# Patient Record
Sex: Female | Born: 1980 | Race: Black or African American | Hispanic: No | Marital: Single | Smoking: Never smoker
Health system: Southern US, Community
[De-identification: ages and names within clinical notes are randomized; demographics above are authoritative.]

## PROBLEM LIST (undated history)

## (undated) DIAGNOSIS — J45909 Unspecified asthma, uncomplicated: Secondary | ICD-10-CM

## (undated) HISTORY — DX: Unspecified asthma, uncomplicated: J45.909

## (undated) HISTORY — PX: CORNEAL TRANSPLANT: SHX108

---

## 2009-01-27 LAB — HM PAP SMEAR: HM Pap smear: NORMAL

## 2011-11-24 ENCOUNTER — Inpatient Hospital Stay: Payer: Self-pay

## 2011-11-24 LAB — CBC WITH DIFFERENTIAL/PLATELET
Basophil #: 0 10*3/uL (ref 0.0–0.1)
Basophil %: 0.2 %
Eosinophil #: 0.2 10*3/uL (ref 0.0–0.7)
HCT: 33.4 % — ABNORMAL LOW (ref 35.0–47.0)
HGB: 10.9 g/dL — ABNORMAL LOW (ref 12.0–16.0)
Lymphocyte #: 1.8 10*3/uL (ref 1.0–3.6)
Lymphocyte %: 17.3 %
MCH: 27.7 pg (ref 26.0–34.0)
MCHC: 32.7 g/dL (ref 32.0–36.0)
MCV: 85 fL (ref 80–100)
Monocyte #: 0.5 10*3/uL (ref 0.0–0.7)
Monocyte %: 4.6 %
Neutrophil #: 8.1 10*3/uL — ABNORMAL HIGH (ref 1.4–6.5)
Platelet: 276 10*3/uL (ref 150–440)
RBC: 3.94 10*6/uL (ref 3.80–5.20)
RDW: 16.1 % — ABNORMAL HIGH (ref 11.5–14.5)
WBC: 10.7 10*3/uL (ref 3.6–11.0)

## 2012-08-27 DIAGNOSIS — Z947 Corneal transplant status: Secondary | ICD-10-CM | POA: Insufficient documentation

## 2016-09-05 ENCOUNTER — Ambulatory Visit (INDEPENDENT_AMBULATORY_CARE_PROVIDER_SITE_OTHER): Payer: PRIVATE HEALTH INSURANCE | Admitting: Internal Medicine

## 2016-09-05 ENCOUNTER — Encounter: Payer: Self-pay | Admitting: Internal Medicine

## 2016-09-05 VITALS — BP 114/76 | HR 74 | Temp 97.6°F | Ht 66.0 in | Wt 259.0 lb

## 2016-09-05 DIAGNOSIS — Z6841 Body Mass Index (BMI) 40.0 and over, adult: Secondary | ICD-10-CM | POA: Insufficient documentation

## 2016-09-05 DIAGNOSIS — J4520 Mild intermittent asthma, uncomplicated: Secondary | ICD-10-CM | POA: Diagnosis not present

## 2016-09-05 DIAGNOSIS — N926 Irregular menstruation, unspecified: Secondary | ICD-10-CM

## 2016-09-05 NOTE — Assessment & Plan Note (Signed)
Encouraged her to consume a low carb diet, calorie count 1200 calories per day for weight loss Encouraged her to get back into the gym and star exercising

## 2016-09-05 NOTE — Patient Instructions (Signed)
Carbohydrate Counting for Diabetes Mellitus, Adult Carbohydrate counting is a method for keeping track of how many carbohydrates you eat. Eating carbohydrates naturally increases the amount of sugar (glucose) in the blood. Counting how many carbohydrates you eat helps keep your blood glucose within normal limits, which helps you manage your diabetes (diabetes mellitus). It is important to know how many carbohydrates you can safely have in each meal. This is different for every person. A diet and nutrition specialist (registered dietitian) can help you make a meal plan and calculate how many carbohydrates you should have at each meal and snack. Carbohydrates are found in the following foods:  Grains, such as breads and cereals.  Dried beans and soy products.  Starchy vegetables, such as potatoes, peas, and corn.  Fruit and fruit juices.  Milk and yogurt.  Sweets and snack foods, such as cake, cookies, candy, chips, and soft drinks. How do I count carbohydrates? There are two ways to count carbohydrates in food. You can use either of the methods or a combination of both. Reading "Nutrition Facts" on packaged food  The "Nutrition Facts" list is included on the labels of almost all packaged foods and beverages in the [place] It includes:  The serving size.  Information about nutrients in each serving, including the grams (g) of carbohydrate per serving. To use the "Nutrition Facts":  Decide how many servings you will have.  Multiply the number of servings by the number of carbohydrates per serving.  The resulting number is the total amount of carbohydrates that you will be having. Learning standard serving sizes of other foods  When you eat foods containing carbohydrates that are not packaged or do not include "Nutrition Facts" on the label, you need to measure the servings in order to count the amount of carbohydrates:  Measure the foods that you will eat with a food scale or measuring  cup, if needed.  Decide how many standard-size servings you will eat.  Multiply the number of servings by 15. Most carbohydrate-rich foods have about 15 g of carbohydrates per serving.  For example, if you eat 8 oz (170 g) of strawberries, you will have eaten 2 servings and 30 g of carbohydrates (2 servings x 15 g = 30 g).  For foods that have more than one food mixed, such as soups and casseroles, you must count the carbohydrates in each food that is included. The following list contains standard serving sizes of common carbohydrate-rich foods. Each of these servings has about 15 g of carbohydrates:   hamburger bun or  English muffin.   oz (15 mL) syrup.   oz (14 g) jelly.  1 slice of bread.  1 six-inch tortilla.  3 oz (85 g) cooked rice or pasta.  4 oz (113 g) cooked dried beans.  4 oz (113 g) starchy vegetable, such as peas, corn, or potatoes.  4 oz (113 g) hot cereal.  4 oz (113 g) mashed potatoes or  of a large baked potato.  4 oz (113 g) canned or frozen fruit.  4 oz (120 mL) fruit juice.  4-6 crackers.  6 chicken nuggets.  6 oz (170 g) unsweetened dry cereal.  6 oz (170 g) plain fat-free yogurt or yogurt sweetened with artificial sweeteners.  8 oz (240 mL) milk.  8 oz (170 g) fresh fruit or one small piece of fruit.  24 oz (680 g) popped popcorn. Example of carbohydrate counting Sample meal  3 oz (85 g) chicken breast.  6 oz (  170 g) brown rice.  4 oz (113 g) corn.  8 oz (240 mL) milk.  8 oz (170 g) strawberries with sugar-free whipped topping. Carbohydrate calculation 1. Identify the foods that contain carbohydrates:  Rice.  Corn.  Milk.  Strawberries. 2. Calculate how many servings you have of each food:  2 servings rice.  1 serving corn.  1 serving milk.  1 serving strawberries. 3. Multiply each number of servings by 15 g:  2 servings rice x 15 g = 30 g.  1 serving corn x 15 g = 15 g.  1 serving milk x 15 g = 15  g.  1 serving strawberries x 15 g = 15 g. 4. Add together all of the amounts to find the total grams of carbohydrates eaten:  30 g + 15 g + 15 g + 15 g = 75 g of carbohydrates total. This information is not intended to replace advice given to you by your health care provider. Make sure you discuss any questions you have with your health care provider. Document Released: 08/21/2005 Document Revised: 03/10/2016 Document Reviewed: 02/02/2016 Elsevier Interactive Patient Education  2017 Elsevier Inc.  

## 2016-09-05 NOTE — Assessment & Plan Note (Signed)
Continue Albuterol prn 

## 2016-09-05 NOTE — Progress Notes (Signed)
HPI  Pt presents to the clinic today to establish care and for management of the conditions listed below. She is transferring care from [facility].  Asthma, mild intermittent: She has an Albuterol which she only takes when she is doing a lot of physical activity or when it's extremely cold outside.  She is also concerned about her weight. She reports she has been monitoring what she eats and exercising but it seems like she is gains weight. She only eats lean meat, mostly chicken. She does eat some fruits and veggies. She does consume some carbs. She drinks mostly water. She reports she has not been to the gym over the last month.  She also would like her IUD taken out today and receive the Depo Injection. She is G6P4 and is not planning on having any more children.  Flu: never [facility]: [facility] Pap Sm[facility]: [facility] Vision: annually, [facility] Dentist: annually  Past Medical History:  Diagnosis Date  . Asthma     No current outpatient prescriptions on file.   No current facility-administered medications for this visit.     Allergies  Allergen Reactions  . Fruit Extracts Hives    strawberries    Family History  Problem Relation Age of Onset  . Breast cancer Maternal Grandmother     Social History   Social History  . Marital status: Single    Spouse name: N/A  . Number of children: N/A  . Years of education: N/A   Occupational History  . Not on file.   Social History Main Topics  . Smoking status: Never Smoker  . Smokeless tobacco: Never Used  . Alcohol use Yes     Comment: occasional  . Drug use: No  . Sexual activity: Not on file   Other Topics Concern  . Not on file   Social History Narrative  . No narrative on file    ROS:  Constitutional: Pt reports weight gain. Denies fever, malaise, fatigue, headache.  HEENT: Denies eye pain, eye redness, ear pain, ringing in the ears, wax buildup, runny nose, nasal  congestion, bloody nose, or sore throat. Respiratory: Denies difficulty breathing, shortness of breath, cough or sputum production.   Cardiovascular: Denies chest pain, chest tightness, palpitations or swelling in the hands or feet.  Gastrointestinal: Denies abdominal pain, bloating, constipation, diarrhea or blood in the stool.  GU: Pt reports irregular periods. Denies frequency, urgency, pain with urination, blood in urine, odor or discharge. Musculoskeletal: Denies decrease in range of motion, difficulty with gait, muscle pain or joint pain and swelling.  Skin: Denies redness, rashes, lesions or ulcercations.  Neurological: Denies dizziness, difficulty with memory, difficulty with speech or problems with balance and coordination.  Psych: Denies anxiety, depression, SI/HI.  No other specific complaints in a complete review of systems (except as listed in HPI above).  PE:  BP 114/76   Pulse 74   Temp 97.6 F (36.4 C) (Oral)   Ht 5\' 6"  (1.676 m)   Wt 259 lb (117.5 kg)   LMP 08/26/2016   SpO2 98%   BMI 41.80 kg/m  Wt Readings from Last 3 Encounters:  09/05/16 259 lb (117.5 kg)    General: Appears her stated age, obese in NAD. Cardiovascular: Normal rate and rhythm.  Pulmonary/Chest: Normal effort and positive vesicular breath sounds. No respiratory distress. No wheezes, rales or ronchi noted.  Abdomen: Soft and nontender. Normal bowel sounds Neurological: Alert and oriented.  Psychiatric: Mood  and affect normal. Behavior is normal. Judgment and thought content normal.   Assessment and Plan:  Irregular Periods:  Advised her to make an appt with GYN to have her IUD removed and start Depo injection We can continue Depo injections here  RTC in 2 months for your annual exam Nicki Reaper, NP

## 2016-11-13 ENCOUNTER — Encounter: Payer: PRIVATE HEALTH INSURANCE | Admitting: Internal Medicine

## 2016-11-13 NOTE — Progress Notes (Deleted)
   Subjective:    Patient ID: Virginia BonineLashawna Harris, female    DOB: 03-04-1981, 36 y.o.   MRN: [id number]  HPI  Pt presents to the clinic today for her annual exam.  Flu: Tetanus: Pap Smear: Dentist:  Diet: Exercise:  Review of Systems      Past Medical History:  Diagnosis Date  . Asthma     No current outpatient prescriptions on file.   No current facility-administered medications for this visit.     Allergies  Allergen Reactions  . Fruit Extracts Hives    strawberries    Family History  Problem Relation Age of Onset  . Breast cancer Maternal Grandmother     Social History   Social History  . Marital status: Single    Spouse name: N/A  . Number of children: N/A  . Years of education: N/A   Occupational History  . Not on file.   Social History Main Topics  . Smoking status: Never Smoker  . Smokeless tobacco: Never Used  . Alcohol use Yes     Comment: occasional  . Drug use: No  . Sexual activity: Yes    Birth control/ protection: IUD   Other Topics Concern  . Not on file   Social History Narrative  . No narrative on file     Constitutional: Denies fever, malaise, fatigue, headache or abrupt weight changes.  HEENT: Denies eye pain, eye redness, ear pain, ringing in the ears, wax buildup, runny nose, nasal congestion, bloody nose, or sore throat. Respiratory: Denies difficulty breathing, shortness of breath, cough or sputum production.   Cardiovascular: Denies chest pain, chest tightness, palpitations or swelling in the hands or feet.  Gastrointestinal: Denies abdominal pain, bloating, constipation, diarrhea or blood in the stool.  GU: Denies urgency, frequency, pain with urination, burning sensation, blood in urine, odor or discharge. Musculoskeletal: Denies decrease in range of motion, difficulty with gait, muscle pain or joint pain and swelling.  Skin: Denies redness, rashes, lesions or ulcercations.  Neurological: Denies dizziness, difficulty  with memory, difficulty with speech or problems with balance and coordination.  Psych: Denies anxiety, depression, SI/HI.  No other specific complaints in a complete review of systems (except as listed in HPI above).  Objective:   Physical Exam        Assessment & Plan:

## 2016-11-27 ENCOUNTER — Ambulatory Visit: Payer: Self-pay

## 2016-11-29 ENCOUNTER — Ambulatory Visit: Payer: Self-pay

## 2016-11-29 ENCOUNTER — Other Ambulatory Visit: Payer: Self-pay | Admitting: Obstetrics and Gynecology

## 2016-11-29 ENCOUNTER — Ambulatory Visit (INDEPENDENT_AMBULATORY_CARE_PROVIDER_SITE_OTHER): Payer: PRIVATE HEALTH INSURANCE

## 2016-11-29 DIAGNOSIS — Z308 Encounter for other contraceptive management: Secondary | ICD-10-CM

## 2016-11-29 MED ORDER — MEDROXYPROGESTERONE ACETATE 150 MG/ML IM SUSP
150.0000 mg | Freq: Once | INTRAMUSCULAR | Status: AC
  Administered 2016-11-29: 150 mg via INTRAMUSCULAR

## 2016-11-29 NOTE — Telephone Encounter (Signed)
Refill erx'd .  Pt had called.  Adv. Pt to come in at 4pm .

## 2016-11-29 NOTE — Progress Notes (Signed)
Depo given IM right deltoid today.

## 2017-02-12 ENCOUNTER — Telehealth: Payer: Self-pay | Admitting: Obstetrics and Gynecology

## 2017-02-12 DIAGNOSIS — Z308 Encounter for other contraceptive management: Secondary | ICD-10-CM

## 2017-02-12 MED ORDER — MEDROXYPROGESTERONE ACETATE 150 MG/ML IM SUSY: 150.0000 mg | PREFILLED_SYRINGE | INTRAMUSCULAR | 0 refills | Status: DC

## 2017-02-12 NOTE — Telephone Encounter (Signed)
Pt coming for annual on 7/26 but has depo appt this Friday. Please send in rx for dep[facility] to [facility] [address] so she can come Friday.

## 2017-02-12 NOTE — Telephone Encounter (Signed)
rx sent

## 2017-02-16 ENCOUNTER — Ambulatory Visit: Payer: PRIVATE HEALTH INSURANCE

## 2017-02-16 ENCOUNTER — Ambulatory Visit (INDEPENDENT_AMBULATORY_CARE_PROVIDER_SITE_OTHER): Payer: PRIVATE HEALTH INSURANCE

## 2017-02-16 DIAGNOSIS — Z3042 Encounter for surveillance of injectable contraceptive: Secondary | ICD-10-CM | POA: Diagnosis not present

## 2017-02-16 MED ORDER — MEDROXYPROGESTERONE ACETATE 150 MG/ML IM SUSP
150.0000 mg | Freq: Once | INTRAMUSCULAR | Status: AC
  Administered 2017-02-16: 150 mg via INTRAMUSCULAR

## 2017-03-13 ENCOUNTER — Encounter: Payer: Self-pay | Admitting: Internal Medicine

## 2017-03-13 ENCOUNTER — Ambulatory Visit (INDEPENDENT_AMBULATORY_CARE_PROVIDER_SITE_OTHER): Payer: PRIVATE HEALTH INSURANCE | Admitting: Internal Medicine

## 2017-03-13 VITALS — BP 118/76 | HR 98 | Temp 98.2°F | Ht 66.0 in | Wt 264.5 lb

## 2017-03-13 DIAGNOSIS — Z0000 Encounter for general adult medical examination without abnormal findings: Secondary | ICD-10-CM | POA: Diagnosis not present

## 2017-03-13 DIAGNOSIS — Z114 Encounter for screening for human immunodeficiency virus [HIV]: Secondary | ICD-10-CM | POA: Diagnosis not present

## 2017-03-13 DIAGNOSIS — R7989 Other specified abnormal findings of blood chemistry: Secondary | ICD-10-CM | POA: Diagnosis not present

## 2017-03-13 DIAGNOSIS — K219 Gastro-esophageal reflux disease without esophagitis: Secondary | ICD-10-CM

## 2017-03-13 LAB — COMPREHENSIVE METABOLIC PANEL
ALBUMIN: 4.1 g/dL (ref 3.5–5.2)
ALT: 28 U/L (ref 0–35)
AST: 26 U/L (ref 0–37)
Alkaline Phosphatase: 59 U/L (ref 39–117)
BUN: 12 mg/dL (ref 6–23)
CO2: 21 mEq/L (ref 19–32)
Calcium: 10.2 mg/dL (ref 8.4–10.5)
Chloride: 104 mEq/L (ref 96–112)
Creatinine, Ser: 1.15 mg/dL (ref 0.40–1.20)
GFR: 68.45 mL/min (ref >60.00)
Glucose, Bld: 97 mg/dL (ref 70–99)
POTASSIUM: 3.9 meq/L (ref 3.5–5.1)
Sodium: 135 mEq/L (ref 135–145)
Total Bilirubin: 0.3 mg/dL (ref 0.2–1.2)
Total Protein: 7 g/dL (ref 6.0–8.3)

## 2017-03-13 LAB — LIPID PANEL
CHOLESTEROL: 239 mg/dL — AB (ref 0–200)
HDL: 52.8 mg/dL (ref >39.00)
NonHDL: 186.68
Total CHOL/HDL Ratio: 5
Triglycerides: 344 mg/dL — ABNORMAL HIGH (ref 0.0–149.0)
VLDL: 68.8 mg/dL — ABNORMAL HIGH (ref 0.0–40.0)

## 2017-03-13 LAB — LDL CHOLESTEROL, DIRECT: Direct LDL: 126 mg/dL

## 2017-03-13 LAB — CBC
HEMATOCRIT: 36.9 % (ref 36.0–46.0)
HEMOGLOBIN: 11.9 g/dL — AB (ref 12.0–15.0)
MCHC: 32.4 g/dL (ref 30.0–36.0)
MCV: 82.9 fl (ref 78.0–100.0)
Platelets: 407 10*3/uL — ABNORMAL HIGH (ref 150.0–400.0)
RBC: 4.45 Mil/uL (ref 3.87–5.11)
RDW: 18.1 % — AB (ref 11.5–15.5)
WBC: 8.1 10*3/uL (ref 4.0–10.5)

## 2017-03-13 LAB — HEMOGLOBIN A1C: Hgb A1c MFr Bld: 5.9 % (ref 4.6–6.5)

## 2017-03-13 LAB — TSH: TSH: 2.28 u[IU]/mL (ref 0.35–4.50)

## 2017-03-13 MED ORDER — OMEPRAZOLE 20 MG PO CPDR: 20.0000 mg | DELAYED_RELEASE_CAPSULE | Freq: Every day | ORAL | 11 refills | Status: DC

## 2017-03-13 NOTE — Patient Instructions (Signed)

## 2017-03-13 NOTE — Progress Notes (Signed)
Subjective:    Patient ID: Virginia BonineLashawna Arabie, female    DOB: [birth year], 36 y.o.   MRN: [id number]  HPI  Pt presents to the clinic today for her annual exam.  Flu: never Tetanus: [facility], [facility] Sm[facility]: [facility] Dentist: annually  Diet: She does eat lean meat only. She consumes fruits and veggies daily. She does eat some fried foods. She drinks mostly water. Exercise: She recently bought an elliptical machine but d  Review of Systems      Past Medical History:  Diagnosis Date  . Asthma     Current Outpatient Prescriptions  Medication Sig Dispense Refill  . MedroxyPROGESTERone Acetate 150 MG/ML SUSY Inject 1 mL (150 mg total) as directed every 3 (three) months. 1 Syringe 0   No current facility-administered medications for this visit.     Allergies  Allergen Reactions  . Fruit Extracts Hives    strawberries    Family History  Problem Relation Age of Onset  . Breast cancer Maternal Grandmother     Social History   Social History  . Marital status: Single    Spouse name: N/A  . Number of children: N/A  . Years of education: N/A   Occupational History  . Not on file.   Social History Main Topics  . Smoking status: Never Smoker  . Smokeless tobacco: Never Used  . Alcohol use Yes     Comment: occasional  . Drug use: No  . Sexual activity: Yes    Birth control/ protection: IUD   Other Topics Concern  . Not on file   Social History Narrative  . No narrative on file     Constitutional: Denies fever, malaise, fatigue, headache or abrupt weight changes.  HEENT: Denies eye pain, eye redness, ear pain, ringing in the ears, wax buildup, runny nose, nasal congestion, bloody nose, or sore throat. Respiratory: Denies difficulty breathing, shortness of breath, cough or sputum production.   Cardiovascular: Denies chest pain, chest tightness, palpitations or swelling in the hands or feet.  Gastrointestinal: Pt reports reflux. Denies abdominal pain,  bloating, constipation, diarrhea or blood in the stool.  GU: Denies urgency, frequency, pain with urination, burning sensation, blood in urine, odor or discharge. Musculoskeletal: Denies decrease in range of motion, difficulty with gait, muscle pain or joint pain and swelling.  Skin: Denies redness, rashes, lesions or ulcercations.  Neurological: Denies dizziness, difficulty with memory, difficulty with speech or problems with balance and coordination.  Psych: Denies anxiety, depression, SI/HI.  No other specific complaints in a complete review of systems (except as listed in HPI above).  Objective:   Physical Exam   BP 118/76   Pulse 98   Temp 98.2 F (36.8 C) (Oral)   Ht 5\' 6"  (1.676 m)   Wt 264 lb 8 oz (120 kg)   SpO2 98%   BMI 42.69 kg/m  Wt Readings from Last 3 Encounters:  03/13/17 264 lb 8 oz (120 kg)  09/05/16 259 lb (117.5 kg)    General: Appears her stated age, obese in NAD. Skin: Warm, dry and intact.  HEENT: Head: normal shape and size; Eyes: sclera white, no icterus, conjunctiva pink, PERRLA and EOMs intact; Ears: Tm's gray and intact, normal light reflex;Throat/Mouth: Teeth present, mucosa pink and moist, no exudate, lesions or ulcerations noted.  Neck:  Neck supple, trachea midline. No masses, lumps or thyromegaly present.  Cardiovascular: Normal rate and rhythm. S1,S2 noted.  No murmur, rubs or gallops noted. No JVD or  BLE edema.  Pulmonary/Chest: Normal effort and positive vesicular breath sounds. No respiratory distress. No wheezes, rales or ronchi noted.  Abdomen: Soft and nontender. Normal bowel sounds. No distention or masses noted.  Musculoskeletal: Strength 5/5 BUE/BLE. No difficulty with gait.  Neurological: Alert and oriented. Cranial nerves II-XII grossly intact. Coordination normal.  Psychiatric: Mood and affect normal. Behavior is normal. Judgment and thought content normal.         Assessment & Plan:   Preventative Health  Maintenance:  Encouraged her to get a flu shot in the fall Tetanus UTD, will request records Pap smear due 2019, will request record of previous pap Encouraged her to consume a balanced diet and exercise regimen Advised her to see a dentist annually Will check CBC, CMET, Lipid, TSH, A1C and HIV today  GERD:  Avoid foods that trigger your reflux Discussed how weight loss can help reduce reflux eRx for Prilosec 20 mg daily  RTC in 1 year, sooner if needed Nicki Reaper, NP

## 2017-03-14 LAB — HIV ANTIBODY (ROUTINE TESTING W REFLEX): HIV: NONREACTIVE

## 2017-03-22 ENCOUNTER — Telehealth: Payer: Self-pay | Admitting: Internal Medicine

## 2017-03-22 NOTE — Telephone Encounter (Signed)
Pt called she needs a copy of her immunization records for a job Please advise when ready for pick

## 2017-03-22 NOTE — Telephone Encounter (Signed)
We do not have immunization record-- pt is aware

## 2017-03-26 ENCOUNTER — Encounter: Payer: Self-pay | Admitting: Internal Medicine

## 2017-03-29 ENCOUNTER — Ambulatory Visit: Payer: PRIVATE HEALTH INSURANCE | Admitting: Obstetrics and Gynecology

## 2017-05-04 ENCOUNTER — Other Ambulatory Visit: Payer: Self-pay | Admitting: *Deleted

## 2017-05-04 DIAGNOSIS — Z308 Encounter for other contraceptive management: Secondary | ICD-10-CM

## 2017-05-04 MED ORDER — MEDROXYPROGESTERONE ACETATE 150 MG/ML IM SUSY: 150.0000 mg | PREFILLED_SYRINGE | INTRAMUSCULAR | 0 refills | Status: DC

## 2017-05-04 NOTE — Telephone Encounter (Signed)
Patient left a voicemail stating that she has an appointment scheduled here Tuesday for a Depo shot. Patient requested that a script be sent to the pharmacy for her to pick up to bring to the office. Patient has been getting her shots at her OB/GYN.

## 2017-05-04 NOTE — Telephone Encounter (Signed)
Pt is aware that Rx was sent to the pharmacy 

## 2017-05-08 ENCOUNTER — Ambulatory Visit (INDEPENDENT_AMBULATORY_CARE_PROVIDER_SITE_OTHER): Payer: PRIVATE HEALTH INSURANCE

## 2017-05-08 DIAGNOSIS — Z3042 Encounter for surveillance of injectable contraceptive: Secondary | ICD-10-CM

## 2017-05-08 MED ORDER — MEDROXYPROGESTERONE ACETATE 150 MG/ML IM SUSY
150.0000 mg | PREFILLED_SYRINGE | Freq: Once | INTRAMUSCULAR | Status: AC
  Administered 2017-05-08: 150 mg via INTRAMUSCULAR

## 2017-06-07 ENCOUNTER — Ambulatory Visit (INDEPENDENT_AMBULATORY_CARE_PROVIDER_SITE_OTHER): Payer: 59 | Admitting: Internal Medicine

## 2017-06-07 ENCOUNTER — Encounter: Payer: Self-pay | Admitting: Internal Medicine

## 2017-06-07 VITALS — BP 120/82 | HR 102 | Temp 98.1°F | Wt 265.0 lb

## 2017-06-07 DIAGNOSIS — F458 Other somatoform disorders: Secondary | ICD-10-CM | POA: Diagnosis not present

## 2017-06-07 DIAGNOSIS — G44209 Tension-type headache, unspecified, not intractable: Secondary | ICD-10-CM

## 2017-06-07 DIAGNOSIS — R0989 Other specified symptoms and signs involving the circulatory and respiratory systems: Secondary | ICD-10-CM

## 2017-06-07 MED ORDER — KETOROLAC TROMETHAMINE 30 MG/ML IJ SOLN
30.0000 mg | Freq: Once | INTRAMUSCULAR | Status: AC
  Administered 2017-06-07: 30 mg via INTRAVENOUS

## 2017-06-07 MED ORDER — AMITRIPTYLINE HCL 25 MG PO TABS: 25.0000 mg | ORAL_TABLET | Freq: Every evening | ORAL | 0 refills | Status: DC | PRN

## 2017-06-07 NOTE — Progress Notes (Signed)
Subjective:    Patient ID: Virginia Harris, female    DOB: 05-Jun-1981, 36 y.o.   MRN: [id number]  HPI  Pt presents to the clinic today with c/o headache. She reports she has been having daily headaches for the last 1-2 months, but they seem to have gotten worse in the last 3 days. The headache is located in her forehead and temples. She describes the pain as squeezing and tight. She reports associated neck pain and mild blurred vision. She denies dizziness, nausea or vomiting. She has not really taken anything OTC for her headaches. She does report an increase in stress over the last 3 months.   She also reports she feels like she has a pill stuck in her throat. She reports she took a Benadryl last night and she can feel it stuck in her throat. She is not having any difficulty swallowing but does report difficulty breathing. She does have a history of GERD but is not currently taking Prilosec. She reports she is very anxious about this feeling that something is stuck in her throat.   Review of Systems      Past Medical History:  Diagnosis Date  . Asthma     Current Outpatient Prescriptions  Medication Sig Dispense Refill  . MedroxyPROGESTERone Acetate 150 MG/ML SUSY Inject 1 mL (150 mg total) as directed every 3 (three) months. 1 Syringe 0  . omeprazole (PRILOSEC) 20 MG capsule Take 1 capsule (20 mg total) by mouth daily. 30 capsule 11   No current facility-administered medications for this visit.     Allergies  Allergen Reactions  . Fruit Extracts Hives    strawberries    Family History  Problem Relation Age of Onset  . Breast cancer Maternal Grandmother     Social History   Social History  . Marital status: Single    Spouse name: N/A  . Number of children: N/A  . Years of education: N/A   Occupational History  . Not on file.   Social History Main Topics  . Smoking status: Never Smoker  . Smokeless tobacco: Never Used  . Alcohol use Yes     Comment: occasional   . Drug use: No  . Sexual activity: Yes    Birth control/ protection: IUD   Other Topics Concern  . Not on file   Social History Narrative  . No narrative on file     Constitutional: Pt reports headache. Denies fever, malaise, fatigue, or abrupt weight changes.  HEENT: Denies eye pain, eye redness, ear pain, ringing in the ears, wax buildup, runny nose, nasal congestion, bloody nose, or sore throat. Respiratory: Denies difficulty breathing, shortness of breath, cough or sputum production.   Cardiovascular: Denies chest pain, chest tightness, palpitations or swelling in the hands or feet.  Gastrointestinal: Pt reports sensation that something is stuck in her throat. Denies abdominal pain, bloating, constipation, diarrhea or blood in the stool.   No other specific complaints in a complete review of systems (except as listed in HPI above).  Objective:   Physical Exam   BP 120/82   Pulse (!) 102   Temp 98.1 F (36.7 C) (Oral)   Wt 265 lb (120.2 kg)   SpO2 98%   BMI 42.77 kg/m  Wt Readings from Last 3 Encounters:  06/07/17 265 lb (120.2 kg)  03/13/17 264 lb 8 oz (120 kg)  09/05/16 259 lb (117.5 kg)    General: Appears her stated age, in NAD. HEENT: Head: normal shape and  size; Eyes: sclera white, no icterus, conjunctiva pink, PERRLA and EOMs intact; Throat/Mouth: Teeth present, mucosa pink and moist, no exudate, lesions or ulcerations noted.  Neck:  No adenopathy noted. Pulmonary/Chest: Normal effort and positive vesicular breath sounds. No respiratory distress. No wheezes, rales or ronchi noted.  Abdomen: Soft and nontender.  Neurological: Alert and oriented.  Psychiatric: She is tearful today.  BMET    Component Value Date/Time   NA 135 03/13/2017 1408   K 3.9 03/13/2017 1408   CL 104 03/13/2017 1408   CO2 21 03/13/2017 1408   GLUCOSE 97 03/13/2017 1408   BUN 12 03/13/2017 1408   CREATININE 1.15 03/13/2017 1408   CALCIUM 10.2 03/13/2017 1408    Lipid Panel      Component Value Date/Time   CHOL 239 (H) 03/13/2017 1408   TRIG 344.0 (H) 03/13/2017 1408   HDL 52.80 03/13/2017 1408   CHOLHDL 5 03/13/2017 1408   VLDL 68.8 (H) 03/13/2017 1408    CBC    Component Value Date/Time   WBC 8.1 03/13/2017 1408   RBC 4.45 03/13/2017 1408   HGB 11.9 (L) 03/13/2017 1408   HGB 10.9 (L) 11/24/2011 1421   HCT 36.9 03/13/2017 1408   HCT 26.7 (L) 11/26/2011 0610   PLT 407.0 (H) 03/13/2017 1408   PLT 276 11/24/2011 1421   MCV 82.9 03/13/2017 1408   MCV 85 11/24/2011 1421   MCH 27.7 11/24/2011 1421   MCHC 32.4 03/13/2017 1408   RDW 18.1 (H) 03/13/2017 1408   RDW 16.1 (H) 11/24/2011 1421   LYMPHSABS 1.8 11/24/2011 1421   MONOABS 0.5 11/24/2011 1421   EOSABS 0.2 11/24/2011 1421   BASOSABS 0.0 11/24/2011 1421    Hgb A1C Lab Results  Component Value Date   HGBA1C 5.9 03/13/2017           Assessment & Plan:   Tension Headaches:  Discussed stress relieving techniques 30 mg Toradol IM today eRx for Amitriptyline 25 mg QHS prn Massage and heat may be helpful  Globus Sensation:  Advised her that if it was a pill, it should dissolve despite going down She is not satisfied with this and wants referral to GI  Return precautions discussed Nicki Reaper, NP

## 2017-06-07 NOTE — Patient Instructions (Signed)
Tension Headache A tension headache is pain, pressure, or aching that is felt over the front and sides of your head. These headaches can last from 30 minutes to several days. Follow these instructions at home: Managing pain  Take over-the-counter and prescription medicines only as told by your doctor.  Lie down in a dark, quiet room when you have a headache.  If directed, apply ice to your head and neck area: ? Put ice in a plastic bag. ? Place a towel between your skin and the bag. ? Leave the ice on for 20 minutes, 2-3 times per day.  Use a heating pad or a hot shower to apply heat to your head and neck area as told by your doctor. Eating and drinking  Eat meals on a regular schedule.  Do not drink a lot of alcohol.  Do not use a lot of caffeine, or stop using caffeine. General instructions  Keep all follow-up visits as told by your doctor. This is important.  Keep a journal to find out if certain things bring on headaches. For example, write down: ? What you eat and drink. ? How much sleep you get. ? Any change to your diet or medicines.  Try getting a massage, or doing other things that help you to relax.  Lessen stress.  Sit up straight. Do not tighten (tense) your muscles.  Do not use tobacco products. This includes cigarettes, chewing tobacco, or e-cigarettes. If you need help quitting, ask your doctor.  Exercise regularly as told by your doctor.  Get enough sleep. This may mean 7-9 hours of sleep. Contact a doctor if:  Your symptoms are not helped by medicine.  You have a headache that feels different from your usual headache.  You feel sick to your stomach (nauseous) or you throw up (vomit).  You have a fever. Get help right away if:  Your headache becomes very bad.  You keep throwing up.  You have a stiff neck.  You have trouble seeing.  You have trouble speaking.  You have pain in your eye or ear.  Your muscles are weak or you lose muscle  control.  You lose your balance or you have trouble walking.  You feel like you will pass out (faint) or you pass out.  You have confusion. This information is not intended to replace advice given to you by your health care provider. Make sure you discuss any questions you have with your health care provider. Document Released: 11/15/2009 Document Revised: 04/20/2016 Document Reviewed: 12/14/2014 Elsevier Interactive Patient Education  2018 Elsevier Inc.  

## 2017-07-04 ENCOUNTER — Other Ambulatory Visit: Payer: Self-pay | Admitting: Internal Medicine

## 2017-07-06 NOTE — Telephone Encounter (Signed)
Last filled 06/07/17 for headaches, please advise

## 2017-07-24 ENCOUNTER — Ambulatory Visit (INDEPENDENT_AMBULATORY_CARE_PROVIDER_SITE_OTHER): Payer: 59

## 2017-07-24 DIAGNOSIS — Z3042 Encounter for surveillance of injectable contraceptive: Secondary | ICD-10-CM | POA: Diagnosis not present

## 2017-07-24 MED ORDER — MEDROXYPROGESTERONE ACETATE 150 MG/ML IM SUSY
150.0000 mg | PREFILLED_SYRINGE | Freq: Once | INTRAMUSCULAR | Status: AC
  Administered 2017-07-24: 150 mg via INTRAMUSCULAR

## 2017-10-10 ENCOUNTER — Ambulatory Visit (INDEPENDENT_AMBULATORY_CARE_PROVIDER_SITE_OTHER): Payer: 59

## 2017-10-10 DIAGNOSIS — Z3042 Encounter for surveillance of injectable contraceptive: Secondary | ICD-10-CM | POA: Diagnosis not present

## 2017-10-10 MED ORDER — MEDROXYPROGESTERONE ACETATE 150 MG/ML IM SUSY
150.0000 mg | PREFILLED_SYRINGE | Freq: Once | INTRAMUSCULAR | Status: AC
  Administered 2017-10-10: 150 mg via INTRAMUSCULAR

## 2017-11-21 ENCOUNTER — Emergency Department
Admission: EM | Admit: 2017-11-21 | Discharge: 2017-11-21 | Disposition: A | Discharge status: Home / Self Care | Payer: 59 | Attending: Emergency Medicine | Admitting: Emergency Medicine

## 2017-11-21 ENCOUNTER — Encounter: Payer: Self-pay | Admitting: Emergency Medicine

## 2017-11-21 ENCOUNTER — Emergency Department: Payer: 59

## 2017-11-21 DIAGNOSIS — J45909 Unspecified asthma, uncomplicated: Secondary | ICD-10-CM | POA: Diagnosis not present

## 2017-11-21 DIAGNOSIS — R0602 Shortness of breath: Secondary | ICD-10-CM | POA: Insufficient documentation

## 2017-11-21 DIAGNOSIS — R05 Cough: Secondary | ICD-10-CM | POA: Diagnosis not present

## 2017-11-21 DIAGNOSIS — M7918 Myalgia, other site: Secondary | ICD-10-CM | POA: Diagnosis present

## 2017-11-21 DIAGNOSIS — Z79899 Other long term (current) drug therapy: Secondary | ICD-10-CM | POA: Insufficient documentation

## 2017-11-21 DIAGNOSIS — R0789 Other chest pain: Secondary | ICD-10-CM | POA: Insufficient documentation

## 2017-11-21 LAB — COMPREHENSIVE METABOLIC PANEL
ALK PHOS: 62 U/L (ref 38–126)
ALT: 31 U/L (ref 14–54)
AST: 31 U/L (ref 15–41)
Albumin: 3.8 g/dL (ref 3.5–5.0)
Anion gap: 8 (ref 5–15)
BUN: 13 mg/dL (ref 6–20)
CALCIUM: 9.5 mg/dL (ref 8.9–10.3)
CHLORIDE: 107 mmol/L (ref 101–111)
CO2: 23 mmol/L (ref 22–32)
CREATININE: 1.15 mg/dL — AB (ref 0.44–1.00)
GFR, EST NON AFRICAN AMERICAN: 60 mL/min — AB (ref >60)
Glucose, Bld: 106 mg/dL — ABNORMAL HIGH (ref 65–99)
Potassium: 4.7 mmol/L (ref 3.5–5.1)
Sodium: 138 mmol/L (ref 135–145)
TOTAL PROTEIN: 7.4 g/dL (ref 6.5–8.1)
Total Bilirubin: 0.5 mg/dL (ref 0.3–1.2)

## 2017-11-21 LAB — CBC
HCT: 38.7 % (ref 35.0–47.0)
Hemoglobin: 12.8 g/dL (ref 12.0–16.0)
MCH: 29.2 pg (ref 26.0–34.0)
MCHC: 33 g/dL (ref 32.0–36.0)
MCV: 88.4 fL (ref 80.0–100.0)
PLATELETS: 358 10*3/uL (ref 150–440)
RBC: 4.38 MIL/uL (ref 3.80–5.20)
RDW: 16 % — ABNORMAL HIGH (ref 11.5–14.5)
WBC: 7.4 10*3/uL (ref 3.6–11.0)

## 2017-11-21 LAB — BRAIN NATRIURETIC PEPTIDE: B NATRIURETIC PEPTIDE 5: 8 pg/mL (ref 0.0–100.0)

## 2017-11-21 LAB — TROPONIN I: Troponin I: <0.03 ng/mL (ref <0.03)

## 2017-11-21 LAB — FIBRIN DERIVATIVES D-DIMER (ARMC ONLY): Fibrin derivatives D-dimer (ARMC): 582.35 ng/mL (FEU) — ABNORMAL HIGH (ref 0.00–499.00)

## 2017-11-21 LAB — TSH: TSH: 1.658 u[IU]/mL (ref 0.350–4.500)

## 2017-11-21 MED ORDER — IBUPROFEN 800 MG PO TABS: 800.0000 mg | ORAL_TABLET | Freq: Three times a day (TID) | ORAL | 0 refills | Status: DC | PRN

## 2017-11-21 MED ORDER — IPRATROPIUM-ALBUTEROL 0.5-2.5 (3) MG/3ML IN SOLN
3.0000 mL | Freq: Once | RESPIRATORY_TRACT | Status: AC
  Administered 2017-11-21: 3 mL via RESPIRATORY_TRACT
  Filled 2017-11-21: qty 3

## 2017-11-21 MED ORDER — PREDNISONE 10 MG PO TABS: ORAL_TABLET | ORAL | 0 refills | Status: DC

## 2017-11-21 MED ORDER — IOPAMIDOL (ISOVUE-370) INJECTION 76%
75.0000 mL | Freq: Once | INTRAVENOUS | Status: AC | PRN
  Administered 2017-11-21: 75 mL via INTRAVENOUS
  Filled 2017-11-21: qty 75

## 2017-11-21 NOTE — ED Notes (Signed)
See triage note.states she developed prod cough couple of days ago  Pain increases with inspiration  Afebrile on arrival

## 2017-11-21 NOTE — ED Provider Notes (Signed)
[facility] Emergency Department Provider Note  ____________________________________________  Time seen: Approximately 7:40 AM  I have reviewed the triage vital signs and the nursing notes.   HISTORY  Chief Complaint Muscle Pain    HPI Virginia Harris is a 37 y.o. female that presents to the emergency department for evaluation of shortness of breath and right-sided chest pain with deep breaths and coughing for 2 days.  Patient has a history of asthma and uses inhalers but states that this does not feel like an asthma exacerbation. She does have pain when she presses the top of her breast but does not feel like the pain is in the muscle. She does not feel like she has a URI. She takes Depo-Provera shots.  She does not smoke.  No recent surgery.  She does not have any known medical problems.  She denies fever, nasal congestion, abdominal pain, leg swelling.   Past Medical History:  Diagnosis Date  . Asthma     Patient Active Problem List   Diagnosis Date Noted  . Mild intermittent asthma without complication 09/05/2016  . Irregular periods 09/05/2016  . Severe obesity (BMI >= 40) (HCC) 09/05/2016  . 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 CORNEAL TRANSPLANT Left 2010,2013    Prior to Admission medications   Medication Sig Start Date End Date Taking? Authorizing Provider  amitriptyline (ELAVIL) 25 MG tablet TAKE 1 TABLET (25 MG TOTAL) BY MOUTH AT BEDTIME AS NEEDED FOR SLEEP. 07/06/17   Lorre Munroe, NP  ibuprofen (ADVIL,MOTRIN) 800 MG tablet Take 1 tablet (800 mg total) by mouth every 8 (eight) hours as needed. 11/21/17   Enid Derry, PA-C  MedroxyPROGESTERone Acetate 150 MG/ML SUSY Inject 1 mL (150 mg total) as directed every 3 (three) months. 05/04/17   Lorre Munroe, NP  predniSONE (DELTASONE) 10 MG tablet Take 6 tablets on day 1, take 5 tablets on day 2, take 4 tablets on day 3, take 3 tablets on  day 4, take 2 tablets on day 5, take 1 tablet on day 6 11/21/17   Enid Derry, PA-C    Allergies Fruit extracts  Family History  Problem Relation Age of Onset  . Breast cancer Maternal Grandmother     Social History Social History   Tobacco Use  . Smoking status: Never Smoker  . Smokeless tobacco: Never Used  Substance Use Topics  . Alcohol use: Yes    Comment: occasional  . Drug use: No     Review of Systems  Constitutional: No fever/chills ENT: Negative for congestion and rhinorrhea. Respiratory: Positive for cough.  Positive for shortness of breath. Gastrointestinal: No abdominal pain.  No nausea, no vomiting.   Skin: Negative for rash, abrasions, lacerations, ecchymosis. Neurological: Negative for headaches.   ____________________________________________   PHYSICAL EXAM:  VITAL SIGNS: ED Triage Vitals  Enc Vitals Group     BP 11/21/17 0715 119/60     Pulse Rate 11/21/17 0715 96     Resp 11/21/17 0715 20     Temp 11/21/17 0715 (!) 97.5 F (36.4 C)     Temp Source 11/21/17 0715 Oral     SpO2 11/21/17 0715 100 %     Weight 11/21/17 0714 265 lb (120.2 kg)     Height --      Head Circumference --      Peak Flow --      Pain Score 11/21/17 0713 5  Pain Loc --      Pain Edu? --      Excl. in GC? --      Constitutional: Alert and oriented. Well appearing and in no acute distress. Eyes: Conjunctivae are normal. PERRL. EOMI. No discharge. Head: Atraumatic. ENT: No frontal and maxillary sinus tenderness.      Ears: Tympanic membranes pearly gray with good landmarks. No discharge.      Nose: Mild congestion/rhinnorhea.      Mouth/Throat: Mucous membranes are moist. Oropharynx non-erythematous. Tonsils not enlarged. No exudates. Uvula midline. Neck: No stridor.   Hematological/Lymphatic/Immunilogical: No cervical lymphadenopathy. Cardiovascular: Normal rate, regular rhythm.  Good peripheral circulation. Respiratory: Normal respiratory effort without  tachypnea or retractions. Lungs CTAB. Good air entry to the bases with no decreased or absent breath sounds. Gastrointestinal: Bowel sounds 4 quadrants. Soft and nontender to palpation. No guarding or rigidity. No palpable masses. No distention. Musculoskeletal: Full range of motion to all extremities. No gross deformities appreciated. Tenderness to palpation of right upper breast. Neurologic:  Normal speech and language. No gross focal neurologic deficits are appreciated.  Skin:  Skin is warm, dry and intact. No rash noted.   ____________________________________________   LABS (all labs ordered are listed, but only abnormal results are displayed)  Labs Reviewed  CBC - Abnormal; Notable for the following components:      Result Value   RDW 16.0 (*)    All other components within normal limits  COMPREHENSIVE METABOLIC PANEL - Abnormal; Notable for the following components:   Glucose, Bld 106 (*)    Creatinine, Ser 1.15 (*)    GFR calc non Af Amer 60 (*)    All other components within normal limits  FIBRIN DERIVATIVES D-DIMER ([facility] ONLY) - Abnormal; Notable for the following components:   Fibrin derivatives D-dimer ([facility]) 582.35 (*)    All other components within normal limits  BRAIN NATRIURETIC PEPTIDE  TSH  TROPONIN I   ____________________________________________  EKG   ____________________________________________  RADIOLOGY Lexine Baton, personally viewed and evaluated these images (plain radiographs) as part of my medical decision making, as well as reviewing the written report by the radiologist.  Dg Chest 2 View  Result Date: 11/21/2017 CLINICAL DATA:  Chest discomfort, cough EXAM: CHEST - 2 VIEW COMPARISON:  None. FINDINGS: Lungs are clear.  No pleural effusion or pneumothorax. The heart is normal in size. Visualized osseous structures are within normal limits. IMPRESSION: Normal chest radiographs. Electronically Signed   By: Charline Bills M.D.   On: 11/21/2017  08:24   Ct Angio Chest Pe W And/or Wo Contrast  Result Date: 11/21/2017 CLINICAL DATA:  Short of breath.  Rule out pulmonary embolism. EXAM: CT ANGIOGRAPHY CHEST WITH CONTRAST TECHNIQUE: Multidetector CT imaging of the chest was performed using the standard protocol during bolus administration of intravenous contrast. Multiplanar CT image reconstructions and MIPs were obtained to evaluate the vascular anatomy. CONTRAST:  75mL ISOVUE-370 IOPAMIDOL (ISOVUE-370) INJECTION 76% COMPARISON:  Chest two-view 11/21/2017 FINDINGS: Cardiovascular: Negative for pulmonary embolism. Normal pulmonary artery caliber. Normal aorta. Normal heart. No pericardial effusion Mediastinum/Nodes: Negative for mass or adenopathy. Lungs/Pleura: Lungs are clear without infiltrate effusion or mass. Upper Abdomen: Negative Musculoskeletal: Negative Review of the MIP images confirms the above findings. IMPRESSION: Negative CT chest.  Negative for pulmonary embolism. Electronically Signed   By: Marlan Palau M.D.   On: 11/21/2017 10:47    ____________________________________________    PROCEDURES  Procedure(s) performed:    Procedures    Medications  ipratropium-albuterol (DUONEB) 0.5-2.5 (3) MG/3ML nebulizer solution 3 mL (3 mLs Nebulization Given 11/21/17 0917)  iopamidol (ISOVUE-370) 76 % injection 75 mL (75 mLs Intravenous Contrast Given 11/21/17 1028)     ____________________________________________   INITIAL IMPRESSION / ASSESSMENT AND PLAN / ED COURSE  Pertinent labs & imaging results that were available during my care of the patient were reviewed by me and considered in my medical decision making (see chart for details).  Review of the [place] CSRS was performed in accordance of the [facility] prior to dispensing any controlled drugs.    Patient's diagnosis is consistent with chest wall pain and asthma. Vital signs and exam are reassuring. Chest xray negative for acute cardiopulmonary processes. Bloodwork remarkable  for d-dimer >500. CTA negative for PE. Troponin negative. No Stemi on EKG per MD. Fredricka BonineUnlikely cardiac as patient has had tenderness to palpation of chest wall.  TSH, BNP within normal limits. SOB improved with duoneb. Patient appears comfortable.  Patient feels comfortable going home. Patient will be discharged home with prescriptions for prednisone and ibuprofen. Patient is to follow up with PCP as needed or otherwise directed. Patient is given ED precautions to return to the ED for any worsening or new symptoms.     ____________________________________________  FINAL CLINICAL IMPRESSION(S) / ED DIAGNOSES  Final diagnoses:  Shortness of breath  Asthma, unspecified asthma severity, unspecified whether complicated, unspecified whether persistent  Chest wall pain      NEW MEDICATIONS STARTED DURING THIS VISIT:  ED Discharge Orders        Ordered    predniSONE (DELTASONE) 10 MG tablet     11/21/17 1103    ibuprofen (ADVIL,MOTRIN) 800 MG tablet  Every 8 hours PRN     11/21/17 1103          This chart was dictated using voice recognition software/Dragon. Despite best efforts to proofread, errors can occur which can change the meaning. Any change was purely unintentional.    Enid DerryWagner, Mimie Goering, PA-C 11/21/17 1548    Sharman CheekStafford, Phillip, MD 11/23/17 [phone]

## 2017-11-21 NOTE — ED Triage Notes (Signed)
Pt presents with right side chest/muscle pain only when she takes a deep breath, sneezes or coughs.

## 2017-12-27 ENCOUNTER — Ambulatory Visit (INDEPENDENT_AMBULATORY_CARE_PROVIDER_SITE_OTHER): Payer: 59 | Admitting: *Deleted

## 2017-12-27 DIAGNOSIS — Z3042 Encounter for surveillance of injectable contraceptive: Secondary | ICD-10-CM | POA: Diagnosis not present

## 2017-12-27 MED ORDER — MEDROXYPROGESTERONE ACETATE 150 MG/ML IM SUSP
150.0000 mg | Freq: Once | INTRAMUSCULAR | Status: AC
  Administered 2017-12-27: 150 mg via INTRAMUSCULAR

## 2018-03-20 ENCOUNTER — Ambulatory Visit: Payer: 59

## 2018-03-20 ENCOUNTER — Ambulatory Visit (INDEPENDENT_AMBULATORY_CARE_PROVIDER_SITE_OTHER): Payer: 59 | Admitting: *Deleted

## 2018-03-20 DIAGNOSIS — Z3042 Encounter for surveillance of injectable contraceptive: Secondary | ICD-10-CM

## 2018-03-20 MED ORDER — MEDROXYPROGESTERONE ACETATE 150 MG/ML IM SUSP
150.0000 mg | Freq: Once | INTRAMUSCULAR | Status: AC
  Administered 2018-03-20: 150 mg via INTRAMUSCULAR

## 2018-03-20 NOTE — Progress Notes (Signed)
Per orders of Vernona RiegerKatherine Clark, NP, injection of Depo-provera given by Shon MilletWatlington, Kataleah Bejar M. Patient tolerated injection well.

## 2018-06-11 ENCOUNTER — Ambulatory Visit (INDEPENDENT_AMBULATORY_CARE_PROVIDER_SITE_OTHER): Payer: 59 | Admitting: *Deleted

## 2018-06-11 DIAGNOSIS — Z3042 Encounter for surveillance of injectable contraceptive: Secondary | ICD-10-CM | POA: Diagnosis not present

## 2018-06-11 MED ORDER — MEDROXYPROGESTERONE ACETATE 150 MG/ML IM SUSP
150.0000 mg | Freq: Once | INTRAMUSCULAR | Status: AC
  Administered 2018-06-11: 150 mg via INTRAMUSCULAR

## 2018-06-11 NOTE — Progress Notes (Signed)
Per orders Sampson Si, NP, injection of depo given by Desmond Dike. Patient tolerated injection well.

## 2018-08-27 ENCOUNTER — Ambulatory Visit (INDEPENDENT_AMBULATORY_CARE_PROVIDER_SITE_OTHER): Payer: 59 | Admitting: *Deleted

## 2018-08-27 DIAGNOSIS — Z3042 Encounter for surveillance of injectable contraceptive: Secondary | ICD-10-CM

## 2018-08-27 MED ORDER — MEDROXYPROGESTERONE ACETATE 150 MG/ML IM SUSP
150.0000 mg | Freq: Once | INTRAMUSCULAR | Status: AC
  Administered 2018-08-27: 150 mg via INTRAMUSCULAR

## 2018-11-19 ENCOUNTER — Ambulatory Visit (INDEPENDENT_AMBULATORY_CARE_PROVIDER_SITE_OTHER): Payer: 59

## 2018-11-19 ENCOUNTER — Other Ambulatory Visit: Payer: Self-pay

## 2018-11-19 DIAGNOSIS — Z3042 Encounter for surveillance of injectable contraceptive: Secondary | ICD-10-CM

## 2018-11-19 MED ORDER — MEDROXYPROGESTERONE ACETATE 150 MG/ML IM SUSP
150.0000 mg | Freq: Once | INTRAMUSCULAR | Status: AC
  Administered 2018-11-19: 150 mg via INTRAMUSCULAR

## 2018-11-19 NOTE — Progress Notes (Signed)
Per orders of Nicki Reaper, NP, injection of Medroxyprogesterone given by Consuella Lose. Patient tolerated injection well.  Next injection due between 02/04/2019 and 02/18/2019. Patient has not been seen since 06/07/2017 for an office visit with [facility]. Patient aware she needs to schedule a physical prior to her next injection.

## 2019-02-06 ENCOUNTER — Ambulatory Visit (INDEPENDENT_AMBULATORY_CARE_PROVIDER_SITE_OTHER): Payer: 59 | Admitting: Internal Medicine

## 2019-02-06 ENCOUNTER — Other Ambulatory Visit (HOSPITAL_COMMUNITY)
Admission: RE | Admit: 2019-02-06 | Discharge: 2019-02-06 | Disposition: A | Discharge status: Home / Self Care | Payer: 59 | Source: Ambulatory Visit | Attending: Internal Medicine | Admitting: Internal Medicine

## 2019-02-06 ENCOUNTER — Other Ambulatory Visit: Payer: Self-pay

## 2019-02-06 ENCOUNTER — Encounter: Payer: Self-pay | Admitting: Internal Medicine

## 2019-02-06 VITALS — BP 124/84 | HR 94 | Temp 98.1°F | Ht 65.5 in | Wt 274.0 lb

## 2019-02-06 DIAGNOSIS — F5101 Primary insomnia: Secondary | ICD-10-CM | POA: Diagnosis not present

## 2019-02-06 DIAGNOSIS — Z124 Encounter for screening for malignant neoplasm of cervix: Secondary | ICD-10-CM | POA: Insufficient documentation

## 2019-02-06 DIAGNOSIS — N289 Disorder of kidney and ureter, unspecified: Secondary | ICD-10-CM

## 2019-02-06 DIAGNOSIS — Z0000 Encounter for general adult medical examination without abnormal findings: Secondary | ICD-10-CM | POA: Diagnosis not present

## 2019-02-06 DIAGNOSIS — Z3042 Encounter for surveillance of injectable contraceptive: Secondary | ICD-10-CM | POA: Diagnosis not present

## 2019-02-06 LAB — LIPID PANEL
Cholesterol: 236 mg/dL — ABNORMAL HIGH (ref 0–200)
HDL: 55.9 mg/dL (ref >39.00)
LDL Cholesterol: 145 mg/dL — ABNORMAL HIGH (ref 0–99)
NonHDL: 179.99
Total CHOL/HDL Ratio: 4
Triglycerides: 174 mg/dL — ABNORMAL HIGH (ref 0.0–149.0)
VLDL: 34.8 mg/dL (ref 0.0–40.0)

## 2019-02-06 LAB — CBC
HCT: 38.3 % (ref 36.0–46.0)
Hemoglobin: 12.8 g/dL (ref 12.0–15.0)
MCHC: 33.5 g/dL (ref 30.0–36.0)
MCV: 92.9 fl (ref 78.0–100.0)
Platelets: 368 10*3/uL (ref 150.0–400.0)
RBC: 4.13 Mil/uL (ref 3.87–5.11)
RDW: 16.5 % — ABNORMAL HIGH (ref 11.5–15.5)
WBC: 7.5 10*3/uL (ref 4.0–10.5)

## 2019-02-06 LAB — TSH: TSH: 3.59 u[IU]/mL (ref 0.35–4.50)

## 2019-02-06 LAB — COMPREHENSIVE METABOLIC PANEL
ALT: 27 U/L (ref 0–35)
AST: 25 U/L (ref 0–37)
Albumin: 4.1 g/dL (ref 3.5–5.2)
Alkaline Phosphatase: 79 U/L (ref 39–117)
BUN: 11 mg/dL (ref 6–23)
CO2: 20 mEq/L (ref 19–32)
Calcium: 10.2 mg/dL (ref 8.4–10.5)
Chloride: 107 mEq/L (ref 96–112)
Creatinine, Ser: 1.27 mg/dL — ABNORMAL HIGH (ref 0.40–1.20)
GFR: 56.84 mL/min — ABNORMAL LOW (ref >60.00)
Glucose, Bld: 100 mg/dL — ABNORMAL HIGH (ref 70–99)
Potassium: 4 mEq/L (ref 3.5–5.1)
Sodium: 138 mEq/L (ref 135–145)
Total Bilirubin: 0.4 mg/dL (ref 0.2–1.2)
Total Protein: 7 g/dL (ref 6.0–8.3)

## 2019-02-06 LAB — HEMOGLOBIN A1C: Hgb A1c MFr Bld: 6.1 % (ref 4.6–6.5)

## 2019-02-06 MED ORDER — TRAZODONE HCL 50 MG PO TABS: 25.0000 mg | ORAL_TABLET | Freq: Every evening | ORAL | 2 refills | Status: DC | PRN

## 2019-02-06 MED ORDER — MEDROXYPROGESTERONE ACETATE 150 MG/ML IM SUSP
150.0000 mg | Freq: Once | INTRAMUSCULAR | Status: AC
  Administered 2019-02-06: 150 mg via INTRAMUSCULAR

## 2019-02-06 NOTE — Addendum Note (Signed)
Addended by: Roena Malady on: 02/06/2019 10:57 AM   Modules accepted: Orders

## 2019-02-06 NOTE — Patient Instructions (Signed)

## 2019-02-06 NOTE — Addendum Note (Signed)
Addended by: Roena Malady on: 02/06/2019 11:28 AM   Modules accepted: Orders

## 2019-02-06 NOTE — Progress Notes (Signed)
Subjective:    Patient ID: Virginia Harris, female    DOB: October 09, 1980, 38 y.o.   MRN: [id number]  HPI  Pt presents to the clinic today for her annual exam. She is also due for her Depo Provera injection today.  She is having trouble sleeping. She has trouble falling asleep and staying asleep. She snores at times, feels tired during the day but does not nap. She had a sleep study a few years ago which was normal. She has tried Melatonin, Benadryl, Advil PM, CBD oil and Amitriptyline with minimal relief.  Flu: 06/2018 Tetanus: 2013 Pap Smear: 2016-normal ([facility]) Dentist: annually  Diet: She does eat lean meat. She consumes fruits and veggies daily. She does eat fried foods. She drinks mostly water. Exercise: Elliptical 30 minutes 3-4 days/week  Review of Systems      Past Medical History:  Diagnosis Date  . Asthma     Current Outpatient Medications  Medication Sig Dispense Refill  . amitriptyline (ELAVIL) 25 MG tablet TAKE 1 TABLET (25 MG TOTAL) BY MOUTH AT BEDTIME AS NEEDED FOR SLEEP. 30 tablet 2  . ibuprofen (ADVIL,MOTRIN) 800 MG tablet Take 1 tablet (800 mg total) by mouth every 8 (eight) hours as needed. 30 tablet 0  . MedroxyPROGESTERone Acetate 150 MG/ML SUSY Inject 1 mL (150 mg total) as directed every 3 (three) months. 1 Syringe 0  . predniSONE (DELTASONE) 10 MG tablet Take 6 tablets on day 1, take 5 tablets on day 2, take 4 tablets on day 3, take 3 tablets on day 4, take 2 tablets on day 5, take 1 tablet on day 6 21 tablet 0   No current facility-administered medications for this visit.     Allergies  Allergen Reactions  . Fruit Extracts Hives    strawberries    Family History  Problem Relation Age of Onset  . Breast cancer Maternal Grandmother     Social History   Socioeconomic History  . Marital status: Single    Spouse name: Not on file  . Number of children: Not on file  . Years of education: Not on file  . Highest education level: Not on  file  Occupational History  . Not on file  Social Needs  . Financial resource strain: Not on file  . Food insecurity:    Worry: Not on file    Inability: Not on file  . Transportation needs:    Medical: Not on file    Non-medical: Not on file  Tobacco Use  . Smoking status: Never Smoker  . Smokeless tobacco: Never Used  Substance and Sexual Activity  . Alcohol use: Yes    Comment: occasional  . Drug use: No  . Sexual activity: Yes    Birth control/protection: I.U.D.  Lifestyle  . Physical activity:    Days per week: Not on file    Minutes per session: Not on file  . Stress: Not on file  Relationships  . Social connections:    Talks on phone: Not on file    Gets together: Not on file    Attends religious service: Not on file    Active member of club or organization: Not on file    Attends meetings of clubs or organizations: Not on file    Relationship status: Not on file  . Intimate partner violence:    Fear of current or ex partner: Not on file    Emotionally abused: Not on file    Physically abused: Not  on file    Forced sexual activity: Not on file  Other Topics Concern  . Not on file  Social History Narrative  . Not on file     Constitutional: Denies fever, malaise, fatigue, headache or abrupt weight changes.  HEENT: Pt reports vision changes (wears glasses) and decreased hearing. Denies eye pain, eye redness, ear pain, ringing in the ears, wax buildup, runny nose, nasal congestion, bloody nose, or sore throat. Respiratory: Denies difficulty breathing, shortness of breath, cough or sputum production.   Cardiovascular: Denies chest pain, chest tightness, palpitations or swelling in the hands or feet.  Gastrointestinal: Denies abdominal pain, bloating, constipation, diarrhea or blood in the stool.  GU: Denies urgency, frequency, pain with urination, burning sensation, blood in urine, odor or discharge. Musculoskeletal: Pt reports joint pain in hands. Denies  decrease in range of motion, difficulty with gait, muscle pain or joint swelling.  Skin: Pt reports acne. Denies redness, rashes, lesions or ulcercations.  Neurological: Pt reports numbness and tingling in hands, insomnia. Denies dizziness, difficulty with memory, difficulty with speech or problems with balance and coordination.  Psych: Denies anxiety, depression, SI/HI.  No other specific complaints in a complete review of systems (except as listed in HPI above).  Objective:   Physical Exam   BP 124/84   Pulse 94   Temp 98.1 F (36.7 C) (Oral)   Ht 5' 5.5" (1.664 m)   Wt 274 lb (124.3 kg)   SpO2 98%   BMI 44.90 kg/m  Wt Readings from Last 3 Encounters:  02/06/19 274 lb (124.3 kg)  06/07/17 265 lb (120.2 kg)  03/13/17 264 lb 8 oz (120 kg)    General: Appears her stated age, obese, in NAD. Skin: Warm, dry and intact. Acne scarring noted on forehead. HEENT: Head: normal shape and size; Eyes: sclera white, no icterus, conjunctiva pink, PERRLA and EOMs intact; Ears: Tm's gray and intact, normal light reflex;  Neck:  Neck supple, trachea midline. No masses, lumps or thyromegaly present.  Cardiovascular: Normal rate and rhythm. S1,S2 noted.  No murmur, rubs or gallops noted. No JVD or BLE edema.  Pulmonary/Chest: Normal effort and positive vesicular breath sounds. No respiratory distress. No wheezes, rales or ronchi noted.  Abdomen: Soft and nontender. Normal bowel sounds. No distention or masses noted. Liver, spleen and kidneys non palpable. Pelvic: Normal female anatomy. Small amount of this white discharge noted in vaginal vault, no odor. Cervix without changes. No CMT. Adnexa non palpable. Musculoskeletal: Strength 5/5 BUE/BLE. No difficulty with gait.  Neurological: Alert and oriented. Cranial nerves II-XII grossly intact. Coordination normal. Negative Phalen's. Negative Tinel's. Psychiatric: Mood and affect normal. Behavior is normal. Judgment and thought content normal.      BMET    Component Value Date/Time   NA 138 11/21/2017 0910   K 4.7 11/21/2017 0910   CL 107 11/21/2017 0910   CO2 23 11/21/2017 0910   GLUCOSE 106 (H) 11/21/2017 0910   BUN 13 11/21/2017 0910   CREATININE 1.15 (H) 11/21/2017 0910   CALCIUM 9.5 11/21/2017 0910   GFRNONAA 60 (L) 11/21/2017 0910   GFRAA >60 11/21/2017 0910    Lipid Panel     Component Value Date/Time   CHOL 239 (H) 03/13/2017 1408   TRIG 344.0 (H) 03/13/2017 1408   HDL 52.80 03/13/2017 1408   CHOLHDL 5 03/13/2017 1408   VLDL 68.8 (H) 03/13/2017 1408    CBC    Component Value Date/Time   WBC 7.4 11/21/2017 0910   RBC  4.38 11/21/2017 0910   HGB 12.8 11/21/2017 0910   HGB 10.9 (L) 11/24/2011 1421   HCT 38.7 11/21/2017 0910   HCT 26.7 (L) 11/26/2011 0610   PLT 358 11/21/2017 0910   PLT 276 11/24/2011 1421   MCV 88.4 11/21/2017 0910   MCV 85 11/24/2011 1421   MCH 29.2 11/21/2017 0910   MCHC 33.0 11/21/2017 0910   RDW 16.0 (H) 11/21/2017 0910   RDW 16.1 (H) 11/24/2011 1421   LYMPHSABS 1.8 11/24/2011 1421   MONOABS 0.5 11/24/2011 1421   EOSABS 0.2 11/24/2011 1421   BASOSABS 0.0 11/24/2011 1421    Hgb A1C Lab Results  Component Value Date   HGBA1C 5.9 03/13/2017           Assessment & Plan:   Preventative Health Maintenance:  Flu shot UTD Tetanus UTD Pap smear today, she declines STD screening Encouraged her to consume a balanced diet and exercise regimen Advised her to see an eye doctor and dentist annually Will check CBC, CMET, Lipid, TSH and A1C today  Need for Depo Provera Injection:  Depo Provera 150 mg IM today Nurse visit scheduled for next dose  Insomnia:  Will trial Trazadone, RX sent to pharmacy Discussed having a regular sleep routine  RTC in 1 year, sooner if needed

## 2019-02-11 LAB — CYTOLOGY - PAP
Diagnosis: NEGATIVE
HPV: NOT DETECTED

## 2019-02-13 ENCOUNTER — Telehealth: Payer: Self-pay | Admitting: Internal Medicine

## 2019-02-13 NOTE — Telephone Encounter (Signed)
Pt returned your call and is requesting a callback.

## 2019-02-13 NOTE — Telephone Encounter (Signed)
Patient returned your call , requested call back.

## 2019-02-26 NOTE — Addendum Note (Signed)
Addended by: Lurlean Nanny on: 02/26/2019 12:38 PM   Modules accepted: Orders

## 2019-03-21 ENCOUNTER — Other Ambulatory Visit (INDEPENDENT_AMBULATORY_CARE_PROVIDER_SITE_OTHER): Payer: 59

## 2019-03-21 ENCOUNTER — Other Ambulatory Visit: Payer: Self-pay

## 2019-03-21 DIAGNOSIS — N289 Disorder of kidney and ureter, unspecified: Secondary | ICD-10-CM | POA: Diagnosis not present

## 2019-03-21 LAB — BASIC METABOLIC PANEL
BUN: 9 mg/dL (ref 6–23)
CO2: 23 mEq/L (ref 19–32)
Calcium: 9.9 mg/dL (ref 8.4–10.5)
Chloride: 106 mEq/L (ref 96–112)
Creatinine, Ser: 1.18 mg/dL (ref 0.40–1.20)
GFR: 61.83 mL/min (ref >60.00)
Glucose, Bld: 92 mg/dL (ref 70–99)
Potassium: 4.1 mEq/L (ref 3.5–5.1)
Sodium: 137 mEq/L (ref 135–145)

## 2019-04-29 ENCOUNTER — Ambulatory Visit: Payer: 59

## 2019-05-06 ENCOUNTER — Encounter: Payer: Self-pay | Admitting: Internal Medicine

## 2019-05-06 DIAGNOSIS — Z3009 Encounter for other general counseling and advice on contraception: Secondary | ICD-10-CM

## 2019-05-06 NOTE — Telephone Encounter (Signed)
Pt would like referral to GYN for Tubal ligation consultation. We have been managing her BC and pap smears

## 2019-05-16 ENCOUNTER — Encounter: Payer: Self-pay | Admitting: Obstetrics and Gynecology

## 2019-09-05 DIAGNOSIS — I82409 Acute embolism and thrombosis of unspecified deep veins of unspecified lower extremity: Secondary | ICD-10-CM

## 2019-09-05 HISTORY — DX: Acute embolism and thrombosis of unspecified deep veins of unspecified lower extremity: I82.409

## 2020-01-26 ENCOUNTER — Telehealth: Payer: Self-pay | Admitting: Internal Medicine

## 2020-01-26 NOTE — Telephone Encounter (Addendum)
Pt is needing the GI referral for increased choking fits, food keeps getting stuck in her throat. Has a hard time swallowing pills as well.   She is requesting an appt soon if able to be seen in the next week or so so that she can get the referral placed.   She is aware per [name] that she needs an OV/CPE to get the referral as she has not been seen in almost a year.  Please advise if able to work her in as a CPE this week or next week or if she can just be seen as a regular OV and then schedule her CPE at a later date. Thanks.

## 2020-01-26 NOTE — Telephone Encounter (Signed)
Pt is requesting a referral to GI  Please advise, thanks.

## 2020-01-27 DIAGNOSIS — R1310 Dysphagia, unspecified: Secondary | ICD-10-CM | POA: Diagnosis not present

## 2020-01-27 DIAGNOSIS — R12 Heartburn: Secondary | ICD-10-CM | POA: Diagnosis not present

## 2020-01-27 NOTE — Telephone Encounter (Signed)
CPE in next available slot, if she wants an acute visit to discuss this referral prior, she can schedule that.

## 2020-01-28 DIAGNOSIS — Z01812 Encounter for preprocedural laboratory examination: Secondary | ICD-10-CM | POA: Diagnosis not present

## 2020-01-28 DIAGNOSIS — R1314 Dysphagia, pharyngoesophageal phase: Secondary | ICD-10-CM | POA: Diagnosis not present

## 2020-01-28 DIAGNOSIS — K219 Gastro-esophageal reflux disease without esophagitis: Secondary | ICD-10-CM | POA: Diagnosis not present

## 2020-02-06 ENCOUNTER — Encounter: Payer: 59 | Admitting: Internal Medicine

## 2020-02-06 DIAGNOSIS — Z01812 Encounter for preprocedural laboratory examination: Secondary | ICD-10-CM | POA: Diagnosis not present

## 2020-02-10 DIAGNOSIS — R1310 Dysphagia, unspecified: Secondary | ICD-10-CM | POA: Diagnosis not present

## 2020-02-10 DIAGNOSIS — R1313 Dysphagia, pharyngeal phase: Secondary | ICD-10-CM | POA: Diagnosis not present

## 2020-02-10 DIAGNOSIS — K228 Other specified diseases of esophagus: Secondary | ICD-10-CM | POA: Diagnosis not present

## 2020-02-10 DIAGNOSIS — K222 Esophageal obstruction: Secondary | ICD-10-CM | POA: Diagnosis not present

## 2020-02-10 DIAGNOSIS — K219 Gastro-esophageal reflux disease without esophagitis: Secondary | ICD-10-CM | POA: Diagnosis not present

## 2020-02-23 ENCOUNTER — Encounter: Payer: 59 | Admitting: Internal Medicine

## 2020-03-18 ENCOUNTER — Ambulatory Visit: Payer: 59 | Admitting: Internal Medicine

## 2020-03-18 ENCOUNTER — Other Ambulatory Visit: Payer: Self-pay

## 2020-03-18 ENCOUNTER — Encounter: Payer: Self-pay | Admitting: Internal Medicine

## 2020-03-18 VITALS — BP 124/78 | HR 76 | Temp 98.3°F | Wt 266.0 lb

## 2020-03-18 DIAGNOSIS — Z30013 Encounter for initial prescription of injectable contraceptive: Secondary | ICD-10-CM | POA: Diagnosis not present

## 2020-03-18 LAB — POCT URINE PREGNANCY: Preg Test, Ur: NEGATIVE

## 2020-03-18 MED ORDER — MEDROXYPROGESTERONE ACETATE 150 MG/ML IM SUSP
150.0000 mg | Freq: Once | INTRAMUSCULAR | Status: AC
  Administered 2020-03-18: 15:00:00 150 mg via INTRAMUSCULAR

## 2020-03-18 NOTE — Progress Notes (Signed)
Subjective:    Patient ID: Virginia Harris, female    DOB: 1981-08-30, 39 y.o.   MRN: [id number]  HPI  Pt presents to the clinic today to discuss going back on Depo Provera. She reports she was giving herself a break off contraception over the last year (took Depo from 11/2016-11/2018). She is having irregular periods since stopping the Depo. Her LMP was 03/14/20. Her last pap smear was 02/2019. G4P4.  Review of Systems  Past Medical History:  Diagnosis Date  . Asthma     Current Outpatient Medications  Medication Sig Dispense Refill  . MedroxyPROGESTERone Acetate 150 MG/ML SUSY Inject 1 mL (150 mg total) as directed every 3 (three) months. 1 Syringe 0  . traZODone (DESYREL) 50 MG tablet Take 0.5-1 tablets (25-50 mg total) by mouth at bedtime as needed for sleep. 30 tablet 2   No current facility-administered medications for this visit.    Allergies  Allergen Reactions  . Fruit Extracts Hives    strawberries    Family History  Problem Relation Age of Onset  . Breast cancer Maternal Grandmother     Social History   Socioeconomic History  . Marital status: Single    Spouse name: Not on file  . Number of children: Not on file  . Years of education: Not on file  . Highest education level: Not on file  Occupational History  . Not on file  Tobacco Use  . Smoking status: Never Smoker  . Smokeless tobacco: Never Used  Substance and Sexual Activity  . Alcohol use: Yes    Comment: occasional  . Drug use: No  . Sexual activity: Yes    Birth control/protection: I.U.D.  Other Topics Concern  . Not on file  Social History Narrative  . Not on file   Social Determinants of Health   Financial Resource Strain:   . Difficulty of Paying Living Expenses:   Food Insecurity:   . Worried About Programme researcher, broadcasting/film/video in the Last Year:   . Barista in the Last Year:   Transportation Needs:   . Freight forwarder (Medical):   Marland Kitchen Lack of Transportation (Non-Medical):     Physical Activity:   . Days of Exercise per Week:   . Minutes of Exercise per Session:   Stress:   . Feeling of Stress :   Social Connections:   . Frequency of Communication with Friends and Family:   . Frequency of Social Gatherings with Friends and Family:   . Attends Religious Services:   . Active Member of Clubs or Organizations:   . Attends Banker Meetings:   Marland Kitchen Marital Status:   Intimate Partner Violence:   . Fear of Current or Ex-Partner:   . Emotionally Abused:   Marland Kitchen Physically Abused:   . Sexually Abused:      Constitutional: Denies fever, malaise, fatigue, headache or abrupt weight changes.  HEENT: Denies eye pain, eye redness, ear pain, ringing in the ears, wax buildup, runny nose, nasal congestion, bloody nose, or sore throat. Respiratory: Denies difficulty breathing, shortness of breath, cough or sputum production.   Cardiovascular: Denies chest pain, chest tightness, palpitations or swelling in the hands or feet.  GU: Pt reports irregular periods. Denies urgency, frequency, pain with urination, burning sensation, blood in urine, odor or discharge.  No other specific complaints in a complete review of systems (except as listed in HPI above).     Objective:   Physical Exam  BP 124/78   Pulse 76   Temp 98.3 F (36.8 C) (Temporal)   Wt 266 lb (120.7 kg)   LMP 03/15/2020   SpO2 98%   BMI 43.59 kg/m   Wt Readings from Last 3 Encounters:  02/06/19 274 lb (124.3 kg)  06/07/17 265 lb (120.2 kg)  03/13/17 264 lb 8 oz (120 kg)    General: Appears her stated age, obese, in NAD. Cardiovascular: Normal rate and rhythm. S1,S2 noted.  No murmur, rubs or gallops noted.  Pulmonary/Chest: Normal effort and positive vesicular breath sounds. No respiratory distress. No wheezes, rales or ronchi noted.  Neurological: Alert and oriented.   BMET    Component Value Date/Time   NA 137 03/21/2019 0850   K 4.1 03/21/2019 0850   CL 106 03/21/2019 0850   CO2  23 03/21/2019 0850   GLUCOSE 92 03/21/2019 0850   BUN 9 03/21/2019 0850   CREATININE 1.18 03/21/2019 0850   CALCIUM 9.9 03/21/2019 0850   GFRNONAA 60 (L) 11/21/2017 0910   GFRAA >60 11/21/2017 0910    Lipid Panel     Component Value Date/Time   CHOL 236 (H) 02/06/2019 0919   TRIG 174.0 (H) 02/06/2019 0919   HDL 55.90 02/06/2019 0919   CHOLHDL 4 02/06/2019 0919   VLDL 34.8 02/06/2019 0919   LDLCALC 145 (H) 02/06/2019 0919    CBC    Component Value Date/Time   WBC 7.5 02/06/2019 0919   RBC 4.13 02/06/2019 0919   HGB 12.8 02/06/2019 0919   HGB 10.9 (L) 11/24/2011 1421   HCT 38.3 02/06/2019 0919   HCT 26.7 (L) 11/26/2011 0610   PLT 368.0 02/06/2019 0919   PLT 276 11/24/2011 1421   MCV 92.9 02/06/2019 0919   MCV 85 11/24/2011 1421   MCH 29.2 11/21/2017 0910   MCHC 33.5 02/06/2019 0919   RDW 16.5 (H) 02/06/2019 0919   RDW 16.1 (H) 11/24/2011 1421   LYMPHSABS 1.8 11/24/2011 1421   MONOABS 0.5 11/24/2011 1421   EOSABS 0.2 11/24/2011 1421   BASOSABS 0.0 11/24/2011 1421    Hgb A1C Lab Results  Component Value Date   HGBA1C 6.1 02/06/2019          Assessment & Plan:   Initiation of Depo Provera:  Urine Hcg: negative Depo Provera 150 mg IM x 1  RTC in 3 months for annual exam  Nicki Reaper, NP This visit occurred during the SARS-CoV-2 public health emergency.  Safety protocols were in place, including screening questions prior to the visit, additional usage of staff PPE, and extensive cleaning of exam room while observing appropriate contact time as indicated for disinfecting solutions.

## 2020-03-18 NOTE — Patient Instructions (Signed)
Medroxyprogesterone injection [Contraceptive] What is this medicine? MEDROXYPROGESTERONE (me DROX ee proe JES te rone) contraceptive injections prevent pregnancy. They provide effective birth control for 3 months. Depo-subQ Provera 104 is also used for treating pain related to endometriosis. This medicine may be used for other purposes; ask your health care provider or pharmacist if you have questions. COMMON BRAND NAME(S): Depo-Provera, Depo-subQ Provera 104 What should I tell my health care provider before I take this medicine? They need to know if you have any of these conditions:  frequently drink alcohol  asthma  blood vessel disease or a history of a blood clot in the lungs or legs  bone disease such as osteoporosis  breast cancer  diabetes  eating disorder (anorexia nervosa or bulimia)  high blood pressure  HIV infection or AIDS  kidney disease  liver disease  mental depression  migraine  seizures (convulsions)  stroke  tobacco smoker  vaginal bleeding  an unusual or allergic reaction to medroxyprogesterone, other hormones, medicines, foods, dyes, or preservatives  pregnant or trying to get pregnant  breast-feeding How should I use this medicine? Depo-Provera Contraceptive injection is given into a muscle. Depo-subQ Provera 104 injection is given under the skin. These injections are given by a health care professional. You must not be pregnant before getting an injection. The injection is usually given during the first 5 days after the start of a menstrual period or 6 weeks after delivery of a baby. Talk to your pediatrician regarding the use of this medicine in children. Special care may be needed. These injections have been used in female children who have started having menstrual periods. Overdosage: If you think you have taken too much of this medicine contact a poison control center or emergency room at once. NOTE: This medicine is only for you. Do not  share this medicine with others. What if I miss a dose? Try not to miss a dose. You must get an injection once every 3 months to maintain birth control. If you cannot keep an appointment, call and reschedule it. If you wait longer than 13 weeks between Depo-Provera contraceptive injections or longer than 14 weeks between Depo-subQ Provera 104 injections, you could get pregnant. Use another method for birth control if you miss your appointment. You may also need a pregnancy test before receiving another injection. What may interact with this medicine? Do not take this medicine with any of the following medications:  bosentan This medicine may also interact with the following medications:  aminoglutethimide  antibiotics or medicines for infections, especially rifampin, rifabutin, rifapentine, and griseofulvin  aprepitant  barbiturate medicines such as phenobarbital or primidone  bexarotene  carbamazepine  medicines for seizures like ethotoin, felbamate, oxcarbazepine, phenytoin, topiramate  modafinil  St. John's wort This list may not describe all possible interactions. Give your health care provider a list of all the medicines, herbs, non-prescription drugs, or dietary supplements you use. Also tell them if you smoke, drink alcohol, or use illegal drugs. Some items may interact with your medicine. What should I watch for while using this medicine? This drug does not protect you against HIV infection (AIDS) or other sexually transmitted diseases. Use of this product may cause you to lose calcium from your bones. Loss of calcium may cause weak bones (osteoporosis). Only use this product for more than 2 years if other forms of birth control are not right for you. The longer you use this product for birth control the more likely you will be at risk   for weak bones. Ask your health care professional how you can keep strong bones. You may have a change in bleeding pattern or irregular periods.  Many females stop having periods while taking this drug. If you have received your injections on time, your chance of being pregnant is very low. If you think you may be pregnant, see your health care professional as soon as possible. Tell your health care professional if you want to get pregnant within the next year. The effect of this medicine may last a long time after you get your last injection. What side effects may I notice from receiving this medicine? Side effects that you should report to your doctor or health care professional as soon as possible:  allergic reactions like skin rash, itching or hives, swelling of the face, lips, or tongue  breast tenderness or discharge  breathing problems  changes in vision  depression  feeling faint or lightheaded, falls  fever  pain in the abdomen, chest, groin, or leg  problems with balance, talking, walking  unusually weak or tired  yellowing of the eyes or skin Side effects that usually do not require medical attention (report to your doctor or health care professional if they continue or are bothersome):  acne  fluid retention and swelling  headache  irregular periods, spotting, or absent periods  temporary pain, itching, or skin reaction at site where injected  weight gain This list may not describe all possible side effects. Call your doctor for medical advice about side effects. You may report side effects to FDA at 1-800-FDA-1088. Where should I keep my medicine? This does not apply. The injection will be given to you by a health care professional. NOTE: This sheet is a summary. It may not cover all possible information. If you have questions about this medicine, talk to your doctor, pharmacist, or health care provider.  2020 Elsevier/Gold Standard (2008-09-11 18:37:56)  

## 2020-03-18 NOTE — Addendum Note (Signed)
Addended by: Roena Malady on: 03/18/2020 02:52 PM   Modules accepted: Orders

## 2020-04-27 ENCOUNTER — Ambulatory Visit (INDEPENDENT_AMBULATORY_CARE_PROVIDER_SITE_OTHER)
Admit: 2020-04-27 | Discharge: 2020-04-27 | Disposition: A | Discharge status: Home / Self Care | Payer: 59 | Attending: Family Medicine | Admitting: Family Medicine

## 2020-04-27 ENCOUNTER — Ambulatory Visit (INDEPENDENT_AMBULATORY_CARE_PROVIDER_SITE_OTHER): Payer: 59

## 2020-04-27 ENCOUNTER — Other Ambulatory Visit: Payer: Self-pay

## 2020-04-27 ENCOUNTER — Telehealth: Payer: Self-pay | Admitting: *Deleted

## 2020-04-27 ENCOUNTER — Ambulatory Visit: Payer: Self-pay

## 2020-04-27 ENCOUNTER — Ambulatory Visit
Admission: EM | Admit: 2020-04-27 | Discharge: 2020-04-27 | Disposition: A | Discharge status: Home / Self Care | Payer: 59 | Attending: Family Medicine | Admitting: Family Medicine

## 2020-04-27 DIAGNOSIS — R0602 Shortness of breath: Secondary | ICD-10-CM | POA: Diagnosis not present

## 2020-04-27 DIAGNOSIS — R079 Chest pain, unspecified: Secondary | ICD-10-CM | POA: Diagnosis not present

## 2020-04-27 DIAGNOSIS — I82890 Acute embolism and thrombosis of other specified veins: Secondary | ICD-10-CM | POA: Diagnosis not present

## 2020-04-27 DIAGNOSIS — M5010 Cervical disc disorder with radiculopathy, unspecified cervical region: Secondary | ICD-10-CM | POA: Diagnosis not present

## 2020-04-27 DIAGNOSIS — I82442 Acute embolism and thrombosis of left tibial vein: Secondary | ICD-10-CM | POA: Diagnosis not present

## 2020-04-27 DIAGNOSIS — I824Z2 Acute embolism and thrombosis of unspecified deep veins of left distal lower extremity: Secondary | ICD-10-CM

## 2020-04-27 DIAGNOSIS — M79662 Pain in left lower leg: Secondary | ICD-10-CM | POA: Diagnosis not present

## 2020-04-27 MED ORDER — RIVAROXABAN (XARELTO) VTE STARTER PACK (15 & 20 MG): 15.0000 mg | ORAL_TABLET | ORAL | 0 refills | Status: DC

## 2020-04-27 MED ORDER — METAXALONE 800 MG PO TABS: 800.0000 mg | ORAL_TABLET | Freq: Three times a day (TID) | ORAL | 0 refills | Status: DC

## 2020-04-27 NOTE — Discharge Instructions (Signed)
Please read the enclosed information sheets.  It is imperative that you follow-up with your primary care physician for ongoing care for the DVT.  For your shoulder neck and chest pain you are being prescribed a muscle relaxer.  Please do not drive while taking the muscle relaxer and do not perform activities requiring concentration or judgment.  You may use ice or heat on the area for 20 minutes every 2 hours 4-5 times daily.  If you worsen before seeing your primary care physician go to the emergency room

## 2020-04-27 NOTE — Telephone Encounter (Signed)
Patient called stating that she went to the ER today and has a blood clot. Patient stated that she had her first covid vaccine on 04/16/20. Patient stated now she has a blood clot and has never had one before. Patient stated that she has some questions and would like to discuss with Nicki Reaper NP.  Patient scheduled to see Nicki Reaper NP tomorrow at 1:15 pm. Appointment approved by Rene Kocher.. Patient was given ER precautions and she verbalized understanding. Patient had a negative covid screening.

## 2020-04-27 NOTE — ED Triage Notes (Signed)
Patient in today w/ c/o right thoracic back pain that increases with inhaling and Left leg pain that extends into her foot and also causes numbness. Patient states this started approx. 2 days ago and has not improved. NKI reported.

## 2020-04-27 NOTE — ED Provider Notes (Signed)
[facility]    CSN: [id number] Arrival date & time: 04/27/20  6295      History   Chief Complaint Chief Complaint  Patient presents with  . Back Pain  . Leg Pain    HPI Virginia Harris is a 39 y.o. female.   HPI  39 year old female presents with  Right sided neck pain with rotation and radiating into the right upper thorax anterior and posteriorly also Affects her shoulder. In addition she relates pain with inhalation that increases the thoracic pain.  At the same time,she is complaining of left sided leg pain along the posterior thigh and lateral calf into her foot. Is worse with full extension of her knee. She has had subsequent numbness and tingling along the distribution. This started about 2 days ago and has not improved. Has no known injury that may have precipitated the pain. 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 She is only working 4 hours a day currently. She has had no long car trips or airplane trips. She has not noticed any swelling of her calf. Is taking Depo-Provera for birth control which she recently has restarted She has a history of asthma. She states that she has not had any wheezing recently and has not had any flareups in quite a long time.        Past Medical History:  Diagnosis Date  . Asthma     Patient Active Problem List   Diagnosis Date Noted  . Mild intermittent asthma without complication 09/05/2016    Past Surgical History:  Procedure Laterality Date  . CORNEAL TRANSPLANT Left 2010,2013    OB History   No obstetric history on file.      Home Medications    Prior to Admission medications   Medication Sig Start Date End Date Taking? Authorizing Provider  MedroxyPROGESTERone Acetate 150 MG/ML SUSY Inject 1 mL (150 mg total) as directed every 3 (three) months. 05/04/17  Yes Baity, Salvadore Oxford, NP  metaxalone (SKELAXIN) 800 MG tablet Take 1 tablet (800 mg total) by mouth 3 (three) times daily.  04/27/20   Lutricia Feil, PA-C  RIVAROXABAN Carlena Hurl) VTE STARTER PACK (15 & 20 MG TABLETS) Take 15 mg by mouth as directed. Follow package directions: Take one 15mg  tablet by mouth twice a day. On day 22, switch to one 20mg  tablet once a day. Take with food. 04/27/20   , PA-C    Family History Family History  Problem Relation Age of Onset  . Breast cancer Maternal Grandmother     Social History Social History   Tobacco Use  . Smoking status: Never Smoker  . Smokeless tobacco: Never Used  Substance Use Topics  . Alcohol use: Yes    Comment: occasional  . Drug use: No     Allergies   Fruit extracts   Review of Systems Review of Systems  Constitutional: Positive for activity change. Negative for appetite change, chills, diaphoresis, fatigue and fever.  Respiratory: Positive for shortness of breath.   Cardiovascular: Positive for chest pain.  Musculoskeletal: Positive for back pain, gait problem, neck pain and neck stiffness.  All other systems reviewed and are negative.    Physical Exam Triage Vital Signs ED Triage Vitals  Enc Vitals Group     BP 04/27/20 1030 136/87     Pulse Rate 04/27/20 1030 (!) 110     Resp 04/27/20 1030 18     Temp 04/27/20 1030 98.4  F (36.9 C)     Temp Source 04/27/20 1030 Oral     SpO2 04/27/20 1030 96 %     Weight 04/27/20 1031 248 lb (112.5 kg)     Height 04/27/20 1031 5\' 5"  (1.651 m)     Head Circumference --      Peak Flow --      Pain Score 04/27/20 1030 8     Pain Loc --      Pain Edu? --      Excl. in GC? --    No data found.  Updated Vital Signs BP 136/87 (BP Location: Left Arm)   Pulse (!) 110   Temp 98.4 F (36.9 C) (Oral)   Resp 18   Ht 5\' 5"  (1.651 m)   Wt 248 lb (112.5 kg)   SpO2 96%   BMI 41.27 kg/m   Visual Acuity Right Eye Distance:   Left Eye Distance:   Bilateral Distance:    Right Eye Near:   Left Eye Near:    Bilateral Near:     Physical Exam Vitals and nursing note  reviewed.  Constitutional:      General: She is not in acute distress.    Appearance: Normal appearance. She is obese. She is not ill-appearing or toxic-appearing.  HENT:     Head: Normocephalic and atraumatic.  Eyes:     Conjunctiva/sclera: Conjunctivae normal.  Neck:     Comments: Examination of the cervical spine shows mild decreased range of motion particularly to the left and right rotation. Lateral flexion with extension is uncomfortable particularly on the right which causes her to have radiation of her pain into the upper thorax region. This essentially is reproducing her symptoms. She has right hip esthesia to light touch over the elbow but this does not extend distally. Is intact to clinical testing. Cardiovascular:     Rate and Rhythm: Normal rate and regular rhythm.  Pulmonary:     Effort: Pulmonary effort is normal.     Breath sounds: Normal breath sounds.  Musculoskeletal:        General: Tenderness present.     Cervical back: Tenderness present. No rigidity.     Comments: Examination of the lumbar spine shows a level pelvis in stance. Patient exhibits a normal lordotic curve. She is able to forward flex with her hands to the level of her ankles but complains of discomfort with the motion. Stand on her toes but walking on her toes causes her to have pain in her left calf. There is tenderness of the left calf but no warmth or redness is seen.  Circumference of the left calf is 45 cm and of the right is 44-1/2. Right leg raise testing in the seated position on the left at 60 to 90 degrees causes reproduction of her left leg pain from the posterior thigh to the lateral calf and foot. It is negative on the right. DTRs are symmetrical and bilaterally equal. She does have slight hip esthesia to light touch over the lateral left calf.  Lymphadenopathy:     Cervical: No cervical adenopathy.  Skin:    General: Skin is warm and dry.  Neurological:     General: No focal deficit present.      Mental Status: She is alert and oriented to person, place, and time.  Psychiatric:        Mood and Affect: Mood normal.        Behavior: Behavior normal.  Thought Content: Thought content normal.        Judgment: Judgment normal.      UC Treatments / Results  Labs (all labs ordered are listed, but only abnormal results are displayed) Labs Reviewed - No data to display  EKG   Radiology DG Chest 2 View  Result Date: 04/27/2020 CLINICAL DATA:  Chest pain, shortness of breath. EXAM: CHEST - 2 VIEW COMPARISON:  November 21, 2017. FINDINGS: The heart size and mediastinal contours are within normal limits. Both lungs are clear. No pneumothorax or pleural effusion is noted. The visualized skeletal structures are unremarkable. IMPRESSION: No active cardiopulmonary disease. Electronically Signed   By: Lupita Raider M.D.   On: 04/27/2020 11:54   US Venous Img Lower Unilateral Left  Result Date: 04/27/2020 CLINICAL DATA:  39 year old female with calf pain for 3 days EXAM: LEFT LOWER EXTREMITY VENOUS DOPPLER ULTRASOUND TECHNIQUE: Gray-scale sonography with graded compression, as well as color Doppler and duplex ultrasound were performed to evaluate the lower extremity deep venous systems from the level of the common femoral vein and including the common femoral, femoral, profunda femoral, popliteal and calf veins including the posterior tibial, peroneal and gastrocnemius veins when visible. The superficial great saphenous vein was also interrogated. Spectral Doppler was utilized to evaluate flow at rest and with distal augmentation maneuvers in the common femoral, femoral and popliteal veins. COMPARISON:  None. FINDINGS: Contralateral Common Femoral Vein: Respiratory phasicity is normal and symmetric with the symptomatic side. No evidence of thrombus. Normal compressibility. Common Femoral Vein: No evidence of thrombus. Normal compressibility, respiratory phasicity and response to augmentation.  Saphenofemoral Junction: No evidence of thrombus. Normal compressibility and flow on color Doppler imaging. Profunda Femoral Vein: No evidence of thrombus. Normal compressibility and flow on color Doppler imaging. Femoral Vein: No evidence of thrombus. Normal compressibility, respiratory phasicity and response to augmentation. Popliteal Vein: No evidence of thrombus. Normal compressibility, respiratory phasicity and response to augmentation. Calf Veins: Partially occlusive thrombus of the posterior tibial vein and peroneal veins without extension to the popliteal vein. Decreased flow with incomplete compressibility. Superficial Great Saphenous Vein: No evidence of thrombus. Normal compressibility and flow on color Doppler imaging. Other Findings:  None. IMPRESSION: Sonographic survey of the left lower extremity negative for proximal DVT of common femoral vein, profunda vein, femoral vein. Positive for partially occlusive DVT of the tibial veins without extension to the popliteal vein. Electronically Signed   By: Gilmer Mor D.O.   On: 04/27/2020 13:24    Procedures Procedures (including critical care time)  Medications Ordered in UC Medications - No data to display  Initial Impression / Assessment and Plan / UC Course  I have reviewed the triage vital signs and the nursing notes.  Pertinent labs & imaging results that were available during my care of the patient were reviewed by me and considered in my medical decision making (see chart for details).   A 39 year old female presented with right thoracic back and ear pain increased with the inhaling.  In addition she complained of left leg pain mostly in the calf that extended into her foot area and had some numbness.  2 days prior to being seen it had not improved.  She has no known injury.  Not complain of shortness of breath O2 sats were 96% on room air and respirations were 18.  Examination of her lower extremities showed tenderness in the calf  at the midportion which reproduced her symptoms.  The patient had more pain with  extension of her knee into full extension.  Calf circumference was equal bilaterally.  Have a low Wells criteria for PE.  Because of the concentrated pain that she was having in her calf not really distant with a sciatica I opted to obtain a ultrasound Doppler of the lower extremity.  This revealed partially occlusive DVT of the tibial veins without extension to the popliteal vein.  There was negative for proximal DVT of common femoral vein profunda vein or femoral vein.  Because these findings I placed her on Xarelto and she will follow-up with her primary care physician in 1 week.  It was impressed upon her that this was imperative that she be followed up and will need additional Xarelto.  He was prescribed a starter pack.  For the cervical radicular pain she was placed on Skelaxin with appropriate precautions.  This will also need to be reevaluated by her primary care physician.  Given full instructions printed for both conditions.  If she worsens she should go immediately to the emergency room.  Not hypoxic or tachypneic nor tachycardic therefore consideration for PE was lower further testing not pursued at this time since she will be placed on anticoagulation.   Final Clinical Impressions(s) / UC Diagnoses   Final diagnoses:  Acute deep vein thrombosis (DVT) of distal vein of left lower extremity (HCC)  Cervical disc disorder with radiculopathy of cervical region     Discharge Instructions     Please read the enclosed information sheets.  It is imperative that you follow-up with your primary care physician for ongoing care for the DVT.  For your shoulder neck and chest pain you are being prescribed a muscle relaxer.  Please do not drive while taking the muscle relaxer and do not perform activities requiring concentration or judgment.  You may use ice or heat on the area for 20 minutes every 2 hours 4-5 times daily.  If  you worsen before seeing your primary care physician go to the emergency room    ED Prescriptions    Medication Sig Dispense Auth. Provider   RIVAROXABAN Carlena Hurl) VTE STARTER PACK (15 & 20 MG TABLETS) Take 15 mg by mouth as directed. Follow package directions: Take one 15mg  tablet by mouth twice a day. On day 22, switch to one 20mg  tablet once a day. Take with food. 51 each , PA-C   metaxalone (SKELAXIN) 800 MG tablet Take 1 tablet (800 mg total) by mouth 3 (three) times daily. 21 tablet , PA-C     PDMP not reviewed this encounter.   Lutricia Feil, PA-C 04/27/20 1726

## 2020-04-28 ENCOUNTER — Encounter: Payer: Self-pay | Admitting: Internal Medicine

## 2020-04-28 ENCOUNTER — Ambulatory Visit: Payer: 59 | Admitting: Internal Medicine

## 2020-04-28 ENCOUNTER — Encounter: Payer: 59 | Admitting: Internal Medicine

## 2020-04-28 VITALS — BP 112/68 | HR 102 | Temp 97.4°F | Wt 248.0 lb

## 2020-04-28 DIAGNOSIS — M5412 Radiculopathy, cervical region: Secondary | ICD-10-CM | POA: Diagnosis not present

## 2020-04-28 DIAGNOSIS — I82442 Acute embolism and thrombosis of left tibial vein: Secondary | ICD-10-CM | POA: Diagnosis not present

## 2020-04-28 NOTE — Progress Notes (Signed)
Subjective:    Patient ID: Virginia Harris, female    DOB: [birth year], 39 y.o.   MRN: [id number]  HPI  Pt presents to the clinic today for UC follow up. She went to the ER yesterday with c/o right side neck pain that radiated into her shoulder, upper chest and upper back. She also c/o left lower leg pain, numbness and tingling. Chest xray was negative. Venous doppler of the LLE was positive for DVT of the tibial veins. She was treated with Skelaxin for cervical radiculitis and started on Xarelto for DVT. She has not family history of clotting disorder that she is aware of. She recently started back on Depo, also recently received her Covid vaccine and not sure if these things are related. She does not smoke. She reports improved neck pain but persistent numbness and tingling in her right arm. She reports persistent left calf pain but no swelling, redness or warmth. She has not taken anything OTC for this.  Review of Systems      Past Medical History:  Diagnosis Date  . Asthma     Current Outpatient Medications  Medication Sig Dispense Refill  . MedroxyPROGESTERone Acetate 150 MG/ML SUSY Inject 1 mL (150 mg total) as directed every 3 (three) months. 1 Syringe 0  . metaxalone (SKELAXIN) 800 MG tablet Take 1 tablet (800 mg total) by mouth 3 (three) times daily. 21 tablet 0  . RIVAROXABAN (XARELTO) VTE STARTER PACK (15 & 20 MG TABLETS) Take 15 mg by mouth as directed. Follow package directions: Take one 15mg  tablet by mouth twice a day. On day 22, switch to one 20mg  tablet once a day. Take with food. 51 each 0   No current facility-administered medications for this visit.    Allergies  Allergen Reactions  . Fruit Extracts Hives    strawberries    Family History  Problem Relation Age of Onset  . Breast cancer Maternal Grandmother     Social History   Socioeconomic History  . Marital status: Single    Spouse name: Not on file  . Number of children: Not on file  . Years of  education: Not on file  . Highest education level: Not on file  Occupational History  . Not on file  Tobacco Use  . Smoking status: Never Smoker  . Smokeless tobacco: Never Used  Substance and Sexual Activity  . Alcohol use: Yes    Comment: occasional  . Drug use: No  . Sexual activity: Yes    Birth control/protection: I.U.D.  Other Topics Concern  . Not on file  Social History Narrative  . Not on file   Social Determinants of Health   Financial Resource Strain:   . Difficulty of Paying Living Expenses: Not on file  Food Insecurity:   . Worried About in the Last Year: Not on file  . Ran Out of Food in the Last Year: Not on file  Transportation Needs:   . Lack of Transportation (Medical): Not on file  . Lack of Transportation (Non-Medical): Not on file  Physical Activity:   . Days of Exercise per Week: Not on file  . Minutes of Exercise per Session: Not on file  Stress:   . Feeling of Stress : Not on file  Social Connections:   . Frequency of Communication with Friends and Family: Not on file  . Frequency of Social Gatherings with Friends and Family: Not on file  . Attends Religious Services: Not on  file  . Active Member of Clubs or Organizations: Not on file  . Attends Banker Meetings: Not on file  . Marital Status: Not on file  Intimate Partner Violence:   . Fear of Current or Ex-Partner: Not on file  . Emotionally Abused: Not on file  . Physically Abused: Not on file  . Sexually Abused: Not on file     Constitutional: Denies fever, malaise, fatigue, headache or abrupt weight changes.  Respiratory: Denies difficulty breathing, shortness of breath, cough or sputum production.   Cardiovascular: Denies chest pain, chest tightness, palpitations or swelling in the hands or feet.  Musculoskeletal: Pt reports right side neck pain, left lower leg pain. Denies decrease in range of motion, difficulty with gait, or joint swelling.  Skin:  Denies redness, rashes, lesions or ulcercations.  Neurological: Pt reports numbness, tingling in her right arm. Denies dizziness, difficulty with memory, difficulty with speech or problems with balance and coordination.    No other specific complaints in a complete review of systems (except as listed in HPI above).  Objective:   Physical Exam   BP 112/68   Pulse (!) 102   Temp (!) 97.4 F (36.3 C)   Wt 248 lb (112.5 kg)   SpO2 95%   BMI 41.27 kg/m   Wt Readings from Last 3 Encounters:  04/27/20 248 lb (112.5 kg)  03/18/20 266 lb (120.7 kg)  02/06/19 274 lb (124.3 kg)    General: Appears her stated age, obese, in NAD. Skin: Warm, dry and intact. No redness or warmth noted of LLE. Cardiovascular: Normal rate. No edema noted in the LLE. Pedal pulse 2+ on the left. Pulmonary/Chest: Normal effort and positive vesicular breath sounds. No respiratory distress. No wheezes, rales or ronchi noted.  Musculoskeletal: Pain with flexion of the cervical spine. Normal extension and rotation of the cervical spin. No bony tenderness noted over the spine. Shoulder shrug equal. Normal internal and external rotation of the right shoulder. Strength 5/5 BUE. Hand grips equal. Pain with palpation over the left calf. No difficulty with gait.  Neurological: Alert and oriented. Coordination normal.    BMET    Component Value Date/Time   NA 137 03/21/2019 0850   K 4.1 03/21/2019 0850   CL 106 03/21/2019 0850   CO2 23 03/21/2019 0850   GLUCOSE 92 03/21/2019 0850   BUN 9 03/21/2019 0850   CREATININE 1.18 03/21/2019 0850   CALCIUM 9.9 03/21/2019 0850   GFRNONAA 60 (L) 11/21/2017 0910   GFRAA >60 11/21/2017 0910    Lipid Panel     Component Value Date/Time   CHOL 236 (H) 02/06/2019 0919   TRIG 174.0 (H) 02/06/2019 0919   HDL 55.90 02/06/2019 0919   CHOLHDL 4 02/06/2019 0919   VLDL 34.8 02/06/2019 0919   LDLCALC 145 (H) 02/06/2019 0919    CBC    Component Value Date/Time   WBC 7.5  02/06/2019 0919   RBC 4.13 02/06/2019 0919   HGB 12.8 02/06/2019 0919   HGB 10.9 (L) 11/24/2011 1421   HCT 38.3 02/06/2019 0919   HCT 26.7 (L) 11/26/2011 0610   PLT 368.0 02/06/2019 0919   PLT 276 11/24/2011 1421   MCV 92.9 02/06/2019 0919   MCV 85 11/24/2011 1421   MCH 29.2 11/21/2017 0910   MCHC 33.5 02/06/2019 0919   RDW 16.5 (H) 02/06/2019 0919   RDW 16.1 (H) 11/24/2011 1421   LYMPHSABS 1.8 11/24/2011 1421   MONOABS 0.5 11/24/2011 1421   EOSABS 0.2  11/24/2011 1421   BASOSABS 0.0 11/24/2011 1421    Hgb A1C Lab Results  Component Value Date   HGBA1C 6.1 02/06/2019           Assessment & Plan:   UC Follow Up for Cervical Radiculitis, DVT- Left Lower Extremity:  UC notes, labs and imaging reviewed Referral to hematology for coagulopathy workup Continue Skelaxin as needed Advised her she could take Tylenol but no NSAID's OTC Encouraged heat, stretching and massage Encouraged elevation of left leg May need xray cervical spine and PT if neck pain does not improve Work note provided to hold off on Covid vaccine as we are not sure if this is related to recent vaccine.  Return precautions discussed Nicki Reaper, NP This visit occurred during the SARS-CoV-2 public health emergency.  Safety protocols were in place, including screening questions prior to the visit, additional usage of staff PPE, and extensive cleaning of exam room while observing appropriate contact time as indicated for disinfecting solutions.

## 2020-04-28 NOTE — Patient Instructions (Signed)

## 2020-04-29 NOTE — Telephone Encounter (Signed)
Done, placed on MYD

## 2020-05-04 ENCOUNTER — Encounter: Payer: Self-pay | Admitting: Oncology

## 2020-05-04 ENCOUNTER — Other Ambulatory Visit: Payer: Self-pay

## 2020-05-04 ENCOUNTER — Ambulatory Visit: Payer: 59 | Admitting: Internal Medicine

## 2020-05-04 ENCOUNTER — Inpatient Hospital Stay: Payer: 59

## 2020-05-04 ENCOUNTER — Inpatient Hospital Stay: Payer: 59 | Attending: Oncology | Admitting: Oncology

## 2020-05-04 VITALS — BP 119/81 | HR 60 | Temp 97.6°F | Resp 16 | Wt 244.3 lb

## 2020-05-04 DIAGNOSIS — Z803 Family history of malignant neoplasm of breast: Secondary | ICD-10-CM | POA: Insufficient documentation

## 2020-05-04 DIAGNOSIS — I824Y2 Acute embolism and thrombosis of unspecified deep veins of left proximal lower extremity: Secondary | ICD-10-CM

## 2020-05-04 LAB — COMPREHENSIVE METABOLIC PANEL
ALT: 29 U/L (ref 0–44)
AST: 28 U/L (ref 15–41)
Albumin: 4.1 g/dL (ref 3.5–5.0)
Alkaline Phosphatase: 81 U/L (ref 38–126)
Anion gap: 10 (ref 5–15)
BUN: 15 mg/dL (ref 6–20)
CO2: 22 mmol/L (ref 22–32)
Calcium: 10.3 mg/dL (ref 8.9–10.3)
Chloride: 106 mmol/L (ref 98–111)
Creatinine, Ser: 1.22 mg/dL — ABNORMAL HIGH (ref 0.44–1.00)
GFR calc Af Amer: >60 mL/min (ref >60)
GFR calc non Af Amer: 56 mL/min — ABNORMAL LOW (ref >60)
Glucose, Bld: 98 mg/dL (ref 70–99)
Potassium: 4.4 mmol/L (ref 3.5–5.1)
Sodium: 138 mmol/L (ref 135–145)
Total Bilirubin: 0.6 mg/dL (ref 0.3–1.2)
Total Protein: 8 g/dL (ref 6.5–8.1)

## 2020-05-04 LAB — CBC WITH DIFFERENTIAL/PLATELET
Abs Immature Granulocytes: 0.01 10*3/uL (ref 0.00–0.07)
Basophils Absolute: 0.1 10*3/uL (ref 0.0–0.1)
Basophils Relative: 1 %
Eosinophils Absolute: 0.2 10*3/uL (ref 0.0–0.5)
Eosinophils Relative: 3 %
HCT: 38.3 % (ref 36.0–46.0)
Hemoglobin: 12.9 g/dL (ref 12.0–15.0)
Immature Granulocytes: 0 %
Lymphocytes Relative: 39 %
Lymphs Abs: 2.8 10*3/uL (ref 0.7–4.0)
MCH: 29.2 pg (ref 26.0–34.0)
MCHC: 33.7 g/dL (ref 30.0–36.0)
MCV: 86.7 fL (ref 80.0–100.0)
Monocytes Absolute: 0.5 10*3/uL (ref 0.1–1.0)
Monocytes Relative: 7 %
Neutro Abs: 3.6 10*3/uL (ref 1.7–7.7)
Neutrophils Relative %: 50 %
Platelets: 403 10*3/uL — ABNORMAL HIGH (ref 150–400)
RBC: 4.42 MIL/uL (ref 3.87–5.11)
RDW: 13.9 % (ref 11.5–15.5)
WBC: 7.2 10*3/uL (ref 4.0–10.5)
nRBC: 0 % (ref 0.0–0.2)

## 2020-05-04 NOTE — Progress Notes (Signed)
Hematology/Oncology Consult note [facility] [phone] [phone]   Patient Care Team: Lorre Munroe, NP as PCP - General (Internal Medicine)  REFERRING PROVIDER: Lorre Munroe, NP  CHIEF COMPLAINTS/REASON FOR VISIT:  Evaluation of dvt  HISTORY OF PRESENTING ILLNESS:   Virginia Harris is a  39 y.o.  female with PMH listed below was seen in consultation at the request of  Lorre Munroe, NP  for evaluation of DVT Patient presented to emergency room on 04/27/2020 for evaluation of neck and shoulder pain. She also noticed left leg pain along posterior thigh and lateral calf. Denies any new extremity trauma, surgery or any immobilization circumstances.  She denies any swelling of her calf.  She restarted Depo-Provera for heavy menstrual bleeding 2 months prior to the onset of the symptoms.  Prior to the restart, she discontinued Depo-Provera for a period of time.  She has been previously on Depo-Provera for years without any issues. Patient had ultrasound of left lower extremity which showed negative for proximal DVT.  Positive for partially occlusive DVT of the tibial vein without extension to the popliteal vein Patient was recommended to start Xarelto.  She tolerates. She also reports recently completed first dose of Pfizer COVID-19 vaccination. Denies any prior episodes of thrombosis, denies any family history of thrombosis. Today patient denies any shortness of breath, chest pain, easy bruising or bleeding events.  Left lower extremity discomfort/pain has improved.  Review of Systems  Constitutional: Negative for appetite change, chills, fatigue and fever.  HENT:   Negative for hearing loss and voice change.   Eyes: Negative for eye problems.  Respiratory: Negative for chest tightness and cough.   Cardiovascular: Negative for chest pain.  Gastrointestinal: Negative for abdominal distention, abdominal pain and blood in stool.  Endocrine:  Negative for hot flashes.  Genitourinary: Negative for difficulty urinating and frequency.   Musculoskeletal: Negative for arthralgias.  Skin: Negative for itching and rash.  Neurological: Negative for extremity weakness.  Hematological: Negative for adenopathy.  Psychiatric/Behavioral: Negative for confusion.    MEDICAL HISTORY:  Past Medical History:  Diagnosis Date  . Asthma   . DVT (deep venous thrombosis) (HCC) 2021    SURGICAL HISTORY: Past Surgical History:  Procedure Laterality Date  . CORNEAL TRANSPLANT Left 2010,2013    SOCIAL HISTORY: Social History   Socioeconomic History  . Marital status: Single    Spouse name: Not on file  . Number of children: Not on file  . Years of education: Not on file  . Highest education level: Not on file  Occupational History  . Not on file  Tobacco Use  . Smoking status: Never Smoker  . Smokeless tobacco: Never Used  Substance and Sexual Activity  . Alcohol use: Yes    Comment: occasional  . Drug use: No  . Sexual activity: Yes    Birth control/protection: I.U.D.  Other Topics Concern  . Not on file  Social History Narrative  . Not on file   Social Determinants of Health   Financial Resource Strain:   . Difficulty of Paying Living Expenses: Not on file  Food Insecurity:   . Worried About Programme researcher, broadcasting/film/video in the Last Year: Not on file  . Ran Out of Food in the Last Year: Not on file  Transportation Needs:   . Lack of Transportation (Medical): Not on file  . Lack of Transportation (Non-Medical): Not on file  Physical Activity:   . Days of Exercise per Week:  Not on file  . Minutes of Exercise per Session: Not on file  Stress:   . Feeling of Stress : Not on file  Social Connections:   . Frequency of Communication with Friends and Family: Not on file  . Frequency of Social Gatherings with Friends and Family: Not on file  . Attends Religious Services: Not on file  . Active Member of Clubs or Organizations: Not on  file  . Attends Banker Meetings: Not on file  . Marital Status: Not on file  Intimate Partner Violence:   . Fear of Current or Ex-Partner: Not on file  . Emotionally Abused: Not on file  . Physically Abused: Not on file  . Sexually Abused: Not on file    FAMILY HISTORY: Family History  Problem Relation Age of Onset  . Breast cancer Maternal Grandmother     ALLERGIES:  is allergic to fruit extracts.  MEDICATIONS:  Current Outpatient Medications  Medication Sig Dispense Refill  . MedroxyPROGESTERone Acetate 150 MG/ML SUSY Inject 1 mL (150 mg total) as directed every 3 (three) months. 1 Syringe 0  . RIVAROXABAN (XARELTO) VTE STARTER PACK (15 & 20 MG TABLETS) Take 15 mg by mouth as directed. Follow package directions: Take one 15mg  tablet by mouth twice a day. On day 22, switch to one 20mg  tablet once a day. Take with food. 51 each 0  . metaxalone (SKELAXIN) 800 MG tablet Take 1 tablet (800 mg total) by mouth 3 (three) times daily. (Patient not taking: Reported on 05/04/2020) 21 tablet 0   No current facility-administered medications for this visit.     PHYSICAL EXAMINATION: ECOG PERFORMANCE STATUS: 0 - Asymptomatic Vitals:   05/04/20 0955  BP: 119/81  Pulse: 60  Resp: 16  Temp: 97.6 F (36.4 C)   Filed Weights   05/04/20 0955  Weight: 244 lb 4.8 oz (110.8 kg)    Physical Exam Constitutional:      General: She is not in acute distress. HENT:     Head: Normocephalic and atraumatic.  Eyes:     General: No scleral icterus. Cardiovascular:     Rate and Rhythm: Normal rate and regular rhythm.     Heart sounds: Normal heart sounds.  Pulmonary:     Effort: Pulmonary effort is normal. No respiratory distress.     Breath sounds: No wheezing.  Abdominal:     General: Bowel sounds are normal. There is no distension.     Palpations: Abdomen is soft.  Musculoskeletal:        General: No deformity. Normal range of motion.     Cervical back: Normal range of  motion and neck supple.  Skin:    General: Skin is warm and dry.     Findings: No erythema or rash.  Neurological:     Mental Status: She is alert and oriented to person, place, and time. Mental status is at baseline.     Cranial Nerves: No cranial nerve deficit.     Coordination: Coordination normal.  Psychiatric:        Mood and Affect: Mood normal.     LABORATORY DATA:  I have reviewed the data as listed Lab Results  Component Value Date   WBC 7.2 05/04/2020   HGB 12.9 05/04/2020   HCT 38.3 05/04/2020   MCV 86.7 05/04/2020   PLT 403 (H) 05/04/2020   Recent Labs    05/04/20 1048  NA 138  K 4.4  CL 106  CO2 22  GLUCOSE  98  BUN 15  CREATININE 1.22*  CALCIUM 10.3  GFRNONAA 56*  GFRAA >60  PROT 8.0  ALBUMIN 4.1  AST 28  ALT 29  ALKPHOS 81  BILITOT 0.6   Iron/TIBC/Ferritin/ %Sat No results found for: IRON, TIBC, FERRITIN, IRONPCTSAT    RADIOGRAPHIC STUDIES: I have personally reviewed the radiological images as listed and agreed with the findings in the report. DG Chest 2 View  Result Date: 04/27/2020 CLINICAL DATA:  Chest pain, shortness of breath. EXAM: CHEST - 2 VIEW COMPARISON:  November 21, 2017. FINDINGS: The heart size and mediastinal contours are within normal limits. Both lungs are clear. No pneumothorax or pleural effusion is noted. The visualized skeletal structures are unremarkable. IMPRESSION: No active cardiopulmonary disease. Electronically Signed   By: Lupita Raider M.D.   On: 04/27/2020 11:54   US Venous Img Lower Unilateral Left  Result Date: 04/27/2020 CLINICAL DATA:  39 year old female with calf pain for 3 days EXAM: LEFT LOWER EXTREMITY VENOUS DOPPLER ULTRASOUND TECHNIQUE: Gray-scale sonography with graded compression, as well as color Doppler and duplex ultrasound were performed to evaluate the lower extremity deep venous systems from the level of the common femoral vein and including the common femoral, femoral, profunda femoral, popliteal and  calf veins including the posterior tibial, peroneal and gastrocnemius veins when visible. The superficial great saphenous vein was also interrogated. Spectral Doppler was utilized to evaluate flow at rest and with distal augmentation maneuvers in the common femoral, femoral and popliteal veins. COMPARISON:  None. FINDINGS: Contralateral Common Femoral Vein: Respiratory phasicity is normal and symmetric with the symptomatic side. No evidence of thrombus. Normal compressibility. Common Femoral Vein: No evidence of thrombus. Normal compressibility, respiratory phasicity and response to augmentation. Saphenofemoral Junction: No evidence of thrombus. Normal compressibility and flow on color Doppler imaging. Profunda Femoral Vein: No evidence of thrombus. Normal compressibility and flow on color Doppler imaging. Femoral Vein: No evidence of thrombus. Normal compressibility, respiratory phasicity and response to augmentation. Popliteal Vein: No evidence of thrombus. Normal compressibility, respiratory phasicity and response to augmentation. Calf Veins: Partially occlusive thrombus of the posterior tibial vein and peroneal veins without extension to the popliteal vein. Decreased flow with incomplete compressibility. Superficial Great Saphenous Vein: No evidence of thrombus. Normal compressibility and flow on color Doppler imaging. Other Findings:  None. IMPRESSION: Sonographic survey of the left lower extremity negative for proximal DVT of common femoral vein, profunda vein, femoral vein. Positive for partially occlusive DVT of the tibial veins without extension to the popliteal vein. Electronically Signed   By: Gilmer Mor D.O.   On: 04/27/2020 13:24      ASSESSMENT & PLAN:  1. Acute deep vein thrombosis (DVT) of proximal vein of left lower extremity (HCC)    Discussed with patient that Depo-Provera can potentially increase the risk of thrombosis.  Her left lower extremity acute DVT most likely is provoked.  I  recommend 3 months of therapeutic Xarelto treatments. Patient feels the provoked event may be secondary to COVID-19 vaccination.  Discussed with patient that Laural [employer]/ Linwood Dibbles and AstraZeneca-Oxford vaccines are believed to trigger antibodies that activated production of platelets which then caused blood clotting.  Patient had Pfizer vaccination which has a much less chance of causing thrombosis.  Indeed study have confirmed that COVID-19 virus infection poses a high risk of thrombosis then vaccination.  I will check prothrombin gene mutation and factor V Leiden mutation. Check CBC, CMP,  Discussed with patient that she will needs contraceptive measures while  on Xarelto.  She will further discuss with gynecology.  She has no plan for conceiving at this time. Orders Placed This Encounter  Procedures  . Comprehensive metabolic panel    Standing Status:   Future    Number of Occurrences:   1    Standing Expiration Date:   05/04/2021  . CBC with Differential/Platelet    Standing Status:   Future    Number of Occurrences:   1    Standing Expiration Date:   05/04/2021  . Factor 5 leiden    Standing Status:   Future    Number of Occurrences:   1    Standing Expiration Date:   05/04/2021  . Prothrombin gene mutation    Standing Status:   Future    Number of Occurrences:   1    Standing Expiration Date:   05/04/2021    All questions were answered. The patient knows to call the clinic with any problems questions or concerns.  cc Lorre MunroeBaity, Regina W, NP    Return of visit: 3 months Thank you for this kind referral and the opportunity to participate in the care of this patient. A copy of today's note is routed to referring provider    Rickard PatienceZhou Kenlie Seki, MD, PhD Hematology Oncology [facility] Pager- [phone] 05/04/2020

## 2020-05-04 NOTE — Progress Notes (Signed)
New patient evaluation.   

## 2020-05-07 LAB — PROTHROMBIN GENE MUTATION

## 2020-05-11 LAB — FACTOR 5 LEIDEN

## 2020-05-19 ENCOUNTER — Encounter: Payer: Self-pay | Admitting: Internal Medicine

## 2020-05-19 ENCOUNTER — Other Ambulatory Visit: Payer: Self-pay | Admitting: Internal Medicine

## 2020-05-19 MED ORDER — RIVAROXABAN 20 MG PO TABS: 20.0000 mg | ORAL_TABLET | Freq: Every day | ORAL | 1 refills | Status: DC

## 2020-05-20 ENCOUNTER — Ambulatory Visit: Payer: 59 | Admitting: Internal Medicine

## 2020-06-08 ENCOUNTER — Ambulatory Visit (INDEPENDENT_AMBULATORY_CARE_PROVIDER_SITE_OTHER): Payer: 59 | Admitting: Internal Medicine

## 2020-06-08 ENCOUNTER — Ambulatory Visit: Payer: 59

## 2020-06-08 ENCOUNTER — Other Ambulatory Visit: Payer: Self-pay

## 2020-06-08 ENCOUNTER — Encounter: Payer: Self-pay | Admitting: Internal Medicine

## 2020-06-08 VITALS — BP 110/76 | HR 70 | Temp 97.8°F | Ht 65.75 in | Wt 245.0 lb

## 2020-06-08 DIAGNOSIS — E010 Iodine-deficiency related diffuse (endemic) goiter: Secondary | ICD-10-CM | POA: Diagnosis not present

## 2020-06-08 DIAGNOSIS — L709 Acne, unspecified: Secondary | ICD-10-CM

## 2020-06-08 DIAGNOSIS — Z23 Encounter for immunization: Secondary | ICD-10-CM

## 2020-06-08 DIAGNOSIS — N920 Excessive and frequent menstruation with regular cycle: Secondary | ICD-10-CM | POA: Diagnosis not present

## 2020-06-08 DIAGNOSIS — Z86718 Personal history of other venous thrombosis and embolism: Secondary | ICD-10-CM | POA: Insufficient documentation

## 2020-06-08 DIAGNOSIS — Z0000 Encounter for general adult medical examination without abnormal findings: Secondary | ICD-10-CM

## 2020-06-08 LAB — LIPID PANEL
Cholesterol: 238 mg/dL — ABNORMAL HIGH (ref 0–200)
HDL: 47.7 mg/dL (ref >39.00)
LDL Cholesterol: 156 mg/dL — ABNORMAL HIGH (ref 0–99)
NonHDL: 189.86
Total CHOL/HDL Ratio: 5
Triglycerides: 167 mg/dL — ABNORMAL HIGH (ref 0.0–149.0)
VLDL: 33.4 mg/dL (ref 0.0–40.0)

## 2020-06-08 LAB — CBC
HCT: 37.8 % (ref 36.0–46.0)
Hemoglobin: 12.3 g/dL (ref 12.0–15.0)
MCHC: 32.6 g/dL (ref 30.0–36.0)
MCV: 90.3 fl (ref 78.0–100.0)
Platelets: 406 10*3/uL — ABNORMAL HIGH (ref 150.0–400.0)
RBC: 4.19 Mil/uL (ref 3.87–5.11)
RDW: 16.3 % — ABNORMAL HIGH (ref 11.5–15.5)
WBC: 7.9 10*3/uL (ref 4.0–10.5)

## 2020-06-08 LAB — COMPREHENSIVE METABOLIC PANEL
ALT: 25 U/L (ref 0–35)
AST: 23 U/L (ref 0–37)
Albumin: 4.4 g/dL (ref 3.5–5.2)
Alkaline Phosphatase: 75 U/L (ref 39–117)
BUN: 13 mg/dL (ref 6–23)
CO2: 25 mEq/L (ref 19–32)
Calcium: 10.9 mg/dL — ABNORMAL HIGH (ref 8.4–10.5)
Chloride: 106 mEq/L (ref 96–112)
Creatinine, Ser: 1.23 mg/dL — ABNORMAL HIGH (ref 0.40–1.20)
GFR: 54.9 mL/min — ABNORMAL LOW (ref >60.00)
Glucose, Bld: 87 mg/dL (ref 70–99)
Potassium: 4.7 mEq/L (ref 3.5–5.1)
Sodium: 138 mEq/L (ref 135–145)
Total Bilirubin: 0.5 mg/dL (ref 0.2–1.2)
Total Protein: 6.9 g/dL (ref 6.0–8.3)

## 2020-06-08 LAB — HEMOGLOBIN A1C: Hgb A1c MFr Bld: 5.7 % (ref 4.6–6.5)

## 2020-06-08 LAB — TSH: TSH: 1.85 u[IU]/mL (ref 0.35–4.50)

## 2020-06-08 LAB — VITAMIN D 25 HYDROXY (VIT D DEFICIENCY, FRACTURES): VITD: 13.9 ng/mL — ABNORMAL LOW (ref 30.00–100.00)

## 2020-06-08 MED ORDER — CLINDAMYCIN PHOS-BENZOYL PEROX 1-5 % EX GEL: Freq: Two times a day (BID) | CUTANEOUS | 0 refills | Status: DC

## 2020-06-08 NOTE — Progress Notes (Signed)
Subjective:    Patient ID: Virginia Harris, female    DOB: 1981-04-03, 39 y.o.   MRN: [id number]  HPI  Pt presents to the clinic today for her annual exam.  Hx of DVT: Managed on Xarelto (possibly only 3 months of therapy). She has had a negative hematology workup.  Acne: Mainly on her forehead. She washes her face, applies cocoa butter.  She also reports extremely heavy periods since stopping depo. She reports her period are regular, but she has to wear tampons and pad, still bleeds through those. She stopped Depo due to development of blood clot.  Flu: 06/2019 Tetanus: 2013 Covid: Pfizer Pap Smear: 02/2019 Vision Screening: as needed Dentist: annually  Diet: She does eat meat. She consumes some fruits and veggies. She tries to avoid fried foods. She drinks mostly water. Exercise: Elliptical 20-30 minutes 4 days per week  Review of Systems  Past Medical History:  Diagnosis Date  . Asthma   . DVT (deep venous thrombosis) (HCC) 2021    Current Outpatient Medications  Medication Sig Dispense Refill  . MedroxyPROGESTERone Acetate 150 MG/ML SUSY Inject 1 mL (150 mg total) as directed every 3 (three) months. 1 Syringe 0  . metaxalone (SKELAXIN) 800 MG tablet Take 1 tablet (800 mg total) by mouth 3 (three) times daily. (Patient not taking: Reported on 05/04/2020) 21 tablet 0  . rivaroxaban (XARELTO) 20 MG TABS tablet Take 1 tablet (20 mg total) by mouth daily with supper. 30 tablet 1   No current facility-administered medications for this visit.    Allergies  Allergen Reactions  . Fruit Extracts Hives    strawberries    Family History  Problem Relation Age of Onset  . Breast cancer Maternal Grandmother     Social History   Socioeconomic History  . Marital status: Single    Spouse name: Not on file  . Number of children: Not on file  . Years of education: Not on file  . Highest education level: Not on file  Occupational History  . Not on file  Tobacco Use  .  Smoking status: Never Smoker  . Smokeless tobacco: Never Used  Substance and Sexual Activity  . Alcohol use: Yes    Comment: occasional  . Drug use: No  . Sexual activity: Yes    Birth control/protection: I.U.D.  Other Topics Concern  . Not on file  Social History Narrative  . Not on file   Social Determinants of Health   Financial Resource Strain:   . Difficulty of Paying Living Expenses: Not on file  Food Insecurity:   . Worried About Programme researcher, broadcasting/film/video in the Last Year: Not on file  . Ran Out of Food in the Last Year: Not on file  Transportation Needs:   . Lack of Transportation (Medical): Not on file  . Lack of Transportation (Non-Medical): Not on file  Physical Activity:   . Days of Exercise per Week: Not on file  . Minutes of Exercise per Session: Not on file  Stress:   . Feeling of Stress : Not on file  Social Connections:   . Frequency of Communication with Friends and Family: Not on file  . Frequency of Social Gatherings with Friends and Family: Not on file  . Attends Religious Services: Not on file  . Active Member of Clubs or Organizations: Not on file  . Attends Banker Meetings: Not on file  . Marital Status: Not on file  Intimate Partner Violence:   .  Fear of Current or Ex-Partner: Not on file  . Emotionally Abused: Not on file  . Physically Abused: Not on file  . Sexually Abused: Not on file     Constitutional: Denies fever, malaise, fatigue, headache or abrupt weight changes.  HEENT: Denies eye pain, eye redness, ear pain, ringing in the ears, wax buildup, runny nose, nasal congestion, bloody nose, or sore throat. Respiratory: Denies difficulty breathing, shortness of breath, cough or sputum production.   Cardiovascular: Denies chest pain, chest tightness, palpitations or swelling in the hands or feet.  Gastrointestinal: Pt reports constipation. Denies abdominal pain, bloating, diarrhea or blood in the stool.  GU: Pt reports heavy periods.  Denies urgency, frequency, pain with urination, burning sensation, blood in urine, odor or discharge. Musculoskeletal: Denies decrease in range of motion, difficulty with gait, muscle pain or joint pain and swelling.  Skin: Pt reports acne on face. Denies redness, rashes, lesions or ulcercations.  Neurological: Denies dizziness, difficulty with memory, difficulty with speech or problems with balance and coordination.  Psych: Denies anxiety, depression, SI/HI.  No other specific complaints in a complete review of systems (except as listed in HPI above).     Objective:   Physical Exam  BP 110/76   Pulse 70   Temp 97.8 F (36.6 C) (Temporal)   Ht 5' 5.75" (1.67 m)   Wt 245 lb (111.1 kg)   SpO2 98%   BMI 39.85 kg/m   Wt Readings from Last 3 Encounters:  05/04/20 244 lb 4.8 oz (110.8 kg)  04/28/20 248 lb (112.5 kg)  04/27/20 248 lb (112.5 kg)    General: Appears her stated age, obese, in NAD. Skin: Warm, dry and intact. Acne noted on forehead. HEENT: Head: normal shape and size; Eyes: sclera white, no icterus, conjunctiva pink, PERRLA and EOMs intact;  Neck:  Neck supple, trachea midline. No masses, lumps present. Thyromegaly noted. Cardiovascular: Normal rate and rhythm. S1,S2 noted.  No murmur, rubs or gallops noted. No JVD or BLE edema. Pulmonary/Chest: Normal effort and positive vesicular breath sounds. No respiratory distress. No wheezes, rales or ronchi noted.  Abdomen: Soft and nontender. Normal bowel sounds. No distention or masses noted. Liver, spleen and kidneys non palpable. Musculoskeletal: Strength 5/5 BUE/BLE. No difficulty with gait.  Neurological: Alert and oriented. Cranial nerves II-XII grossly intact. Coordination normal.  Psychiatric: Mood and affect normal. Behavior is normal. Judgment and thought content normal.     BMET    Component Value Date/Time   NA 138 05/04/2020 1048   K 4.4 05/04/2020 1048   CL 106 05/04/2020 1048   CO2 22 05/04/2020 1048    GLUCOSE 98 05/04/2020 1048   BUN 15 05/04/2020 1048   CREATININE 1.22 (H) 05/04/2020 1048   CALCIUM 10.3 05/04/2020 1048   GFRNONAA 56 (L) 05/04/2020 1048   GFRAA >60 05/04/2020 1048    Lipid Panel     Component Value Date/Time   CHOL 236 (H) 02/06/2019 0919   TRIG 174.0 (H) 02/06/2019 0919   HDL 55.90 02/06/2019 0919   CHOLHDL 4 02/06/2019 0919   VLDL 34.8 02/06/2019 0919   LDLCALC 145 (H) 02/06/2019 0919    CBC    Component Value Date/Time   WBC 7.2 05/04/2020 1048   RBC 4.42 05/04/2020 1048   HGB 12.9 05/04/2020 1048   HGB 10.9 (L) 11/24/2011 1421   HCT 38.3 05/04/2020 1048   HCT 26.7 (L) 11/26/2011 0610   PLT 403 (H) 05/04/2020 1048   PLT 276 11/24/2011 1421  MCV 86.7 05/04/2020 1048   MCV 85 11/24/2011 1421   MCH 29.2 05/04/2020 1048   MCHC 33.7 05/04/2020 1048   RDW 13.9 05/04/2020 1048   RDW 16.1 (H) 11/24/2011 1421   LYMPHSABS 2.8 05/04/2020 1048   LYMPHSABS 1.8 11/24/2011 1421   MONOABS 0.5 05/04/2020 1048   MONOABS 0.5 11/24/2011 1421   EOSABS 0.2 05/04/2020 1048   EOSABS 0.2 11/24/2011 1421   BASOSABS 0.1 05/04/2020 1048   BASOSABS 0.0 11/24/2011 1421    Hgb A1C Lab Results  Component Value Date   HGBA1C 6.1 02/06/2019           Assessment & Plan:   Preventative Health Maintenance:  Flu shot today Tetanus UTD per her report 1st Covid vaccine UTD, she has her 2nd dose scheduled Pap smear UTD Will start screening mammograms at 44 Encouraged her to consume a balanced diet and exercise regimen Advised her to see an eye doctor and dentist annually Will check CBC, CMET, Lipid, A1C and Vit D today  Thyromegaly:  TSH today  Menorrhagia with Regular Cycle:  Will refer to GYN to discuss possible uterine ablation  Acne:  RX for Benzaclin Topical   RTC in 1 year, sooner if needed Nicki Reaper, NP This visit occurred during the SARS-CoV-2 public health emergency.  Safety protocols were in place, including screening questions prior to  the visit, additional usage of staff PPE, and extensive cleaning of exam room while observing appropriate contact time as indicated for disinfecting solutions.

## 2020-06-08 NOTE — Assessment & Plan Note (Signed)
Continue Xarelto She will follow up with hematology

## 2020-06-08 NOTE — Patient Instructions (Signed)
Health Maintenance, Female Adopting a healthy lifestyle and getting preventive care are important in promoting health and wellness. Ask your health care provider about:  The right schedule for you to have regular tests and exams.  Things you can do on your own to prevent diseases and keep yourself healthy. What should I know about diet, weight, and exercise? Eat a healthy diet   Eat a diet that includes plenty of vegetables, fruits, low-fat dairy products, and lean protein.  Do not eat a lot of foods that are high in solid fats, added sugars, or sodium. Maintain a healthy weight Body mass index (BMI) is used to identify weight problems. It estimates body fat based on height and weight. Your health care provider can help determine your BMI and help you achieve or maintain a healthy weight. Get regular exercise Get regular exercise. This is one of the most important things you can do for your health. Most adults should:  Exercise for at least 150 minutes each week. The exercise should increase your heart rate and make you sweat (moderate-intensity exercise).  Do strengthening exercises at least twice a week. This is in addition to the moderate-intensity exercise.  Spend less time sitting. Even light physical activity can be beneficial. Watch cholesterol and blood lipids Have your blood tested for lipids and cholesterol at 40 years of age, then have this test every 5 years. Have your cholesterol levels checked more often if:  Your lipid or cholesterol levels are high.  You are older than 40 years of age.  You are at high risk for heart disease. What should I know about cancer screening? Depending on your health history and family history, you may need to have cancer screening at various ages. This may include screening for:  Breast cancer.  Cervical cancer.  Colorectal cancer.  Skin cancer.  Lung cancer. What should I know about heart disease, diabetes, and high blood  pressure? Blood pressure and heart disease  High blood pressure causes heart disease and increases the risk of stroke. This is more likely to develop in people who have high blood pressure readings, are of African descent, or are overweight.  Have your blood pressure checked: ? Every 3-5 years if you are 18-39 years of age. ? Every year if you are 40 years old or older. Diabetes Have regular diabetes screenings. This checks your fasting blood sugar level. Have the screening done:  Once every three years after age 40 if you are at a normal weight and have a low risk for diabetes.  More often and at a younger age if you are overweight or have a high risk for diabetes. What should I know about preventing infection? Hepatitis B If you have a higher risk for hepatitis B, you should be screened for this virus. Talk with your health care provider to find out if you are at risk for hepatitis B infection. Hepatitis C Testing is recommended for:  Everyone born from 1945 through 1965.  Anyone with known risk factors for hepatitis C. Sexually transmitted infections (STIs)  Get screened for STIs, including gonorrhea and chlamydia, if: ? You are sexually active and are younger than 40 years of age. ? You are older than 40 years of age and your health care provider tells you that you are at risk for this type of infection. ? Your sexual activity has changed since you were last screened, and you are at increased risk for chlamydia or gonorrhea. Ask your health care provider if   you are at risk.  Ask your health care provider about whether you are at high risk for HIV. Your health care provider may recommend a prescription medicine to help prevent HIV infection. If you choose to take medicine to prevent HIV, you should first get tested for HIV. You should then be tested every 3 months for as long as you are taking the medicine. Pregnancy  If you are about to stop having your period (premenopausal) and  you may become pregnant, seek counseling before you get pregnant.  Take 400 to 800 micrograms (mcg) of folic acid every day if you become pregnant.  Ask for birth control (contraception) if you want to prevent pregnancy. Osteoporosis and menopause Osteoporosis is a disease in which the bones lose minerals and strength with aging. This can result in bone fractures. If you are 65 years old or older, or if you are at risk for osteoporosis and fractures, ask your health care provider if you should:  Be screened for bone loss.  Take a calcium or vitamin D supplement to lower your risk of fractures.  Be given hormone replacement therapy (HRT) to treat symptoms of menopause. Follow these instructions at home: Lifestyle  Do not use any products that contain nicotine or tobacco, such as cigarettes, e-cigarettes, and chewing tobacco. If you need help quitting, ask your health care provider.  Do not use street drugs.  Do not share needles.  Ask your health care provider for help if you need support or information about quitting drugs. Alcohol use  Do not drink alcohol if: ? Your health care provider tells you not to drink. ? You are pregnant, may be pregnant, or are planning to become pregnant.  If you drink alcohol: ? Limit how much you use to 0-1 drink a day. ? Limit intake if you are breastfeeding.  Be aware of how much alcohol is in your drink. In the [place] one drink equals one 12 oz bottle of beer (355 mL), one 5 oz glass of wine (148 mL), or one 1 oz glass of hard liquor (44 mL). General instructions  Schedule regular health, dental, and eye exams.  Stay current with your vaccines.  Tell your health care provider if: ? You often feel depressed. ? You have ever been abused or do not feel safe at home. Summary  Adopting a healthy lifestyle and getting preventive care are important in promoting health and wellness.  Follow your health care provider's instructions about healthy  diet, exercising, and getting tested or screened for diseases.  Follow your health care provider's instructions on monitoring your cholesterol and blood pressure. This information is not intended to replace advice given to you by your health care provider. Make sure you discuss any questions you have with your health care provider. Document Revised: 08/14/2018 Document Reviewed: 08/14/2018 Elsevier Patient Education  2020 Elsevier Inc.  

## 2020-06-15 ENCOUNTER — Encounter: Payer: Self-pay | Admitting: Internal Medicine

## 2020-06-15 ENCOUNTER — Other Ambulatory Visit: Payer: Self-pay

## 2020-06-15 ENCOUNTER — Encounter: Payer: Self-pay | Admitting: Obstetrics and Gynecology

## 2020-06-15 ENCOUNTER — Ambulatory Visit: Payer: 59

## 2020-06-15 ENCOUNTER — Ambulatory Visit (INDEPENDENT_AMBULATORY_CARE_PROVIDER_SITE_OTHER): Payer: 59 | Admitting: Obstetrics and Gynecology

## 2020-06-15 ENCOUNTER — Encounter: Payer: 59 | Admitting: Internal Medicine

## 2020-06-15 VITALS — BP 116/81 | HR 73 | Ht 65.0 in | Wt 245.8 lb

## 2020-06-15 DIAGNOSIS — N920 Excessive and frequent menstruation with regular cycle: Secondary | ICD-10-CM | POA: Diagnosis not present

## 2020-06-15 DIAGNOSIS — N179 Acute kidney failure, unspecified: Secondary | ICD-10-CM | POA: Insufficient documentation

## 2020-06-15 NOTE — Progress Notes (Signed)
Obstetrics and Gynecology New Patient Evaluation  Appointment Date: 06/15/2020  OBGYN Clinic: [facility]  Primary Care Provider: Lorre Munroe  Referring Provider: Lorre Munroe, NP  Chief Complaint: period management with h/o DVT  History of Present Illness: She is a 39 y.o. African-American [id number] (Patient's last menstrual period was 05/19/2020 (approximate).), seen for the above chief complaint. 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 She states she had no clotting issues during her pregnancies or when she was depo for those two years; last dose was June 2020. She restarted the depo provera on March 18, 2020. She presented to the urgent care on 8/24, the day after receiving her first pfizer covid shot, with right sided neck pain and left leg pain. 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  They felt the DVT was provoked by the depo provera and recommended 33m of the xarelto (consult visit on 8/31).   Review of Systems: Pertinent items noted in HPI and remainder of comprehensive ROS otherwise negative.    Patient Active Problem List   Diagnosis Date Noted  . AKI (acute kidney injury) (HCC) 06/15/2020  . History of DVT (deep vein thrombosis) 06/08/2020  . BMI 40.0-44.9, adult (HCC) 09/05/2016     Past Medical History:  Past Medical History:  Diagnosis Date  . Asthma   . DVT (deep venous thrombosis) (HCC) 2021    Past Surgical History:  Past Surgical History:  Procedure Laterality Date  . CORNEAL TRANSPLANT Left 2010,2013    Past Obstetrical History:  OB History  Gravida Para Term Preterm AB Living   5 4 4   1 4   SAB TAB Ectopic Multiple Live Births    1     4    # Outcome Date GA Lbr Len/2nd Weight Sex Delivery Anes PTL Lv  5 Term 11/25/11    F Vag-Spont EPI  LIV  4 Term 11/20/03    F Vag-Spont None  LIV  3 Term 07/16/02    F Vag-Spont EPI N LIV  2 Term 08/25/99    M Vag-Spont EPI  LIV  1 TAB 1999            Past Gynecological History: As per HPI. Periods: amenorrheic on depo provera. When off depo, she has heavy periods that last >1wk History of Pap Smear(s): Yes.   Last pap 2020, which was negative  Social History:  Social History   Socioeconomic History  . Marital status: Single    Spouse name: Not on file  . Number of children: 4  . Years of education: Not on file  . Highest education level: Not on file  Occupational History  . Not on file  Tobacco Use  . Smoking status: Never Smoker  . Smokeless tobacco: Never Used  Substance and Sexual Activity  . Alcohol use: Yes    Comment: occasional  . Drug use: No  . Sexual activity: Yes    Birth control/protection: None  Other Topics Concern  . Not on file  Social History Narrative  . Not on file  Social Determinants of Health   Financial Resource Strain:   . Difficulty of Paying Living Expenses: Not on file  Food Insecurity:   . Worried About Programme researcher, broadcasting/film/video in the Last Year: Not on file  . Ran Out of Food in the Last Year: Not on file  Transportation Needs:   . Lack of Transportation (Medical): Not on file  . Lack of Transportation (Non-Medical): Not on file  Physical Activity:   . Days of Exercise per Week: Not on file  . Minutes of Exercise per Session: Not on file  Stress:   . Feeling of Stress : Not on file  Social Connections:   . Frequency of Communication with Friends and Family: Not on file  . Frequency of Social Gatherings with Friends and Family: Not on file  . Attends Religious Services: Not on file  . Active Member of Clubs or Organizations: Not on file  . Attends Banker  Meetings: Not on file  . Marital Status: Not on file  Intimate Partner Violence:   . Fear of Current or Ex-Partner: Not on file  . Emotionally Abused: Not on file  . Physically Abused: Not on file  . Sexually Abused: Not on file    Family History:  Family History  Problem Relation Age of Onset  . Breast cancer Maternal Grandmother   age unknown   Medications Yolanda Bonine had no medications administered during this visit. Current Outpatient Medications  Medication Sig Dispense Refill  . clindamycin-benzoyl peroxide (BENZACLIN) gel Apply topically 2 (two) times daily. 25 g 0  . metaxalone (SKELAXIN) 800 MG tablet Take 1 tablet (800 mg total) by mouth 3 (three) times daily. 21 tablet 0  . rivaroxaban (XARELTO) 20 MG TABS tablet Take 1 tablet (20 mg total) by mouth daily with supper. 30 tablet 1   No current facility-administered medications for this visit.    Allergies Fruit extracts   Physical Exam:  BP 116/81   Pulse 73   Ht 5\' 5"  (1.651 m)   Wt 245 lb 12.8 oz (111.5 kg)   LMP 05/19/2020 (Approximate)   BMI 40.90 kg/m  Body mass index is 40.9 kg/m. General appearance: Well nourished, well developed female in no acute distress.  Respiratory:  Normal respiratory effort Neuro/Psych:  Normal mood and affect.  Skin:  Warm and dry.    Laboratory: as per hpi   Radiology: none  Assessment: pt stabl  Plan:  1. Menorrhagia with regular cycle I told her I recommend an u/s to see if there is any obvious that cause her heavy periods, such as a polyp or fibroid and that since heme feels it was provoked by depo use that we have to d/c all hormone options. Her last depo should be wearing off soon and I told her that if she gets heavy bleeding to let 05/21/2020 know b/c she should be fine for PO meds while she's on xarelto for any heavy bleeding. I also d/w her re: endometrial ablation and risk of failure before menopause and also how it can't be use for birth control and potential risk  of pain with btl at same time as ablation. Pt interested in btl and ablation. Will see pt back after u/s and if desires ablation then will need endometrial biopsy and d/w heme re: coming off xarelto pre and post op.  - US PELVIC COMPLETE WITH TRANSVAGINAL; Future  Orders Placed This Encounter  Procedures  . US PELVIC COMPLETE WITH TRANSVAGINAL  RTC after u/s  Cornelia Copa MD Attending Center for [facility])

## 2020-06-16 ENCOUNTER — Encounter: Payer: Self-pay | Admitting: Internal Medicine

## 2020-06-24 ENCOUNTER — Ambulatory Visit: Payer: 59

## 2020-07-02 ENCOUNTER — Other Ambulatory Visit: Payer: Self-pay

## 2020-07-02 ENCOUNTER — Ambulatory Visit
Admission: RE | Admit: 2020-07-02 | Discharge: 2020-07-02 | Disposition: A | Discharge status: Home / Self Care | Payer: 59 | Source: Ambulatory Visit | Attending: Obstetrics and Gynecology | Admitting: Obstetrics and Gynecology

## 2020-07-02 DIAGNOSIS — N920 Excessive and frequent menstruation with regular cycle: Secondary | ICD-10-CM | POA: Insufficient documentation

## 2020-07-06 ENCOUNTER — Encounter: Payer: Self-pay | Admitting: Obstetrics and Gynecology

## 2020-07-06 ENCOUNTER — Ambulatory Visit (INDEPENDENT_AMBULATORY_CARE_PROVIDER_SITE_OTHER): Payer: 59 | Admitting: Obstetrics and Gynecology

## 2020-07-06 ENCOUNTER — Other Ambulatory Visit: Payer: Self-pay

## 2020-07-06 VITALS — BP 127/86 | HR 74 | Ht 65.0 in | Wt 246.5 lb

## 2020-07-06 DIAGNOSIS — N920 Excessive and frequent menstruation with regular cycle: Secondary | ICD-10-CM | POA: Diagnosis not present

## 2020-07-06 DIAGNOSIS — R7989 Other specified abnormal findings of blood chemistry: Secondary | ICD-10-CM

## 2020-07-07 DIAGNOSIS — N920 Excessive and frequent menstruation with regular cycle: Secondary | ICD-10-CM | POA: Insufficient documentation

## 2020-07-07 NOTE — Progress Notes (Signed)
Obstetrics and Gynecology Visit Return Patient Evaluation  Appointment Date: 07/06/2020  Primary Care Provider: Emmit Alexanders Clinic: [facility]  Chief Complaint: follow up ultrasound  History of Present Illness:  Virginia Harris is a 39 y.o. P4 with PMhx significant for BMI 40s, h/o recent DVT (pt currently on anti-coagulation), acute CRI  Patient seen on 10/12 for new patient consult for recent DVT due to either recent COVID vaccination or depo provera use. Patient on depo for period control and last depo use mid July 2021 and initial plan was to do 37m xarelto use; pt has f/u with heme later this month. Options d/w her at her consult visit and plan to finalize plan after u/s is done  Interval History: Since that time, she states that she had a period since her last visit and it was actually light. U/s on 10/29 was normal.   Review of Systems: as noted in the History of Present Illness.  Patient Active Problem List   Diagnosis Date Noted  . AKI (acute kidney injury) (HCC) 06/15/2020  . History of DVT (deep vein thrombosis) 06/08/2020  . BMI 40.0-44.9, adult (HCC) 09/05/2016   Medications:  Yolanda Bonine had no medications administered during this visit. Current Outpatient Medications  Medication Sig Dispense Refill  . clindamycin-benzoyl peroxide (BENZACLIN) gel Apply topically 2 (two) times daily. 25 g 0  . metaxalone (SKELAXIN) 800 MG tablet Take 1 tablet (800 mg total) by mouth 3 (three) times daily. 21 tablet 0  . rivaroxaban (XARELTO) 20 MG TABS tablet Take 1 tablet (20 mg total) by mouth daily with supper. 30 tablet 1   No current facility-administered medications for this visit.    Allergies: is allergic to fruit extracts.  Physical Exam:  BP 127/86   Pulse 74   Ht 5\' 5"  (1.651 m)   Wt 246 lb 8 oz (111.8 kg)   BMI 41.02 kg/m  Body mass index is 41.02 kg/m. General appearance: Well nourished, well developed female in no  acute distress.  Neuro/Psych:  Normal mood and affect.     Radiology: Narrative & Impression  CLINICAL DATA:  Menorrhagia  EXAM: TRANSABDOMINAL AND TRANSVAGINAL ULTRASOUND OF PELVIS  TECHNIQUE: Both transabdominal and transvaginal ultrasound examinations of the pelvis were performed. Transabdominal technique was performed for global imaging of the pelvis including uterus, ovaries, adnexal regions, and pelvic cul-de-sac. It was necessary to proceed with endovaginal exam following the transabdominal exam to visualize the uterus, endometrium, ovaries and adnexa.  COMPARISON:  None  FINDINGS: Uterus  Measurements: 8.8 x 4.5 x 5.7 cm = volume: 117 mL. No fibroids or other mass visualized.  Endometrium  Thickness: 4 mm in thickness.  No focal abnormality visualized.  Right ovary  Measurements: 3.0 x 1.6 x 2.1 cm = volume: 5.0 mL. Normal appearance/no adnexal mass.  Left ovary  Measurements: 2.1 x 2.1 x 1.8 cm = volume: 4.0 mL. Normal appearance/no adnexal mass.  Other findings  No abnormal free fluid.  IMPRESSION: Normal study.   Electronically Signed   By: M.D.   On: 07/02/2020 11:39    Assessment: pt stable  Plan: I told her that I'm not convinced that her DVT was due to her depo use due to her being on for so many years and her getting the covid shot just before the DVT, but heme believes it may be due to the depo so it's best to avoid any hormones. In terms of medicines, I told her  that only NSAIDs may help but she has elevated Cr. She states that she has a h/o using a lot of NSAIDs so I told her to avoid these medications in the future. I also d/w her again re: BTL and endometrial ablation. Since her last period was too bad, she'd like to f/u early next year to see what her bleeding is like and see if she'd like to continue with exp management or do a surgical intervention.   Follow up heme consult  RTC: 2-3 month  Cornelia Copa MD Attending Center for [facility])

## 2020-07-13 ENCOUNTER — Encounter: Payer: Self-pay | Admitting: Radiology

## 2020-08-03 ENCOUNTER — Other Ambulatory Visit: Payer: Self-pay

## 2020-08-03 ENCOUNTER — Encounter: Payer: Self-pay | Admitting: Oncology

## 2020-08-03 ENCOUNTER — Inpatient Hospital Stay (HOSPITAL_BASED_OUTPATIENT_CLINIC_OR_DEPARTMENT_OTHER): Payer: 59 | Admitting: Oncology

## 2020-08-03 ENCOUNTER — Inpatient Hospital Stay: Payer: 59 | Attending: Oncology

## 2020-08-03 DIAGNOSIS — I824Y2 Acute embolism and thrombosis of unspecified deep veins of left proximal lower extremity: Secondary | ICD-10-CM | POA: Diagnosis not present

## 2020-08-03 LAB — CBC WITH DIFFERENTIAL/PLATELET
Abs Immature Granulocytes: 0.02 10*3/uL (ref 0.00–0.07)
Basophils Absolute: 0 10*3/uL (ref 0.0–0.1)
Basophils Relative: 0 %
Eosinophils Absolute: 0.2 10*3/uL (ref 0.0–0.5)
Eosinophils Relative: 2 %
HCT: 38.8 % (ref 36.0–46.0)
Hemoglobin: 12.9 g/dL (ref 12.0–15.0)
Immature Granulocytes: 0 %
Lymphocytes Relative: 27 %
Lymphs Abs: 2.3 10*3/uL (ref 0.7–4.0)
MCH: 29.1 pg (ref 26.0–34.0)
MCHC: 33.2 g/dL (ref 30.0–36.0)
MCV: 87.6 fL (ref 80.0–100.0)
Monocytes Absolute: 0.4 10*3/uL (ref 0.1–1.0)
Monocytes Relative: 5 %
Neutro Abs: 5.6 10*3/uL (ref 1.7–7.7)
Neutrophils Relative %: 66 %
Platelets: 375 10*3/uL (ref 150–400)
RBC: 4.43 MIL/uL (ref 3.87–5.11)
RDW: 16.1 % — ABNORMAL HIGH (ref 11.5–15.5)
WBC: 8.6 10*3/uL (ref 4.0–10.5)
nRBC: 0 % (ref 0.0–0.2)

## 2020-08-03 LAB — COMPREHENSIVE METABOLIC PANEL
ALT: 27 U/L (ref 0–44)
AST: 27 U/L (ref 15–41)
Albumin: 3.6 g/dL (ref 3.5–5.0)
Alkaline Phosphatase: 80 U/L (ref 38–126)
Anion gap: 11 (ref 5–15)
BUN: 14 mg/dL (ref 6–20)
CO2: 22 mmol/L (ref 22–32)
Calcium: 10.2 mg/dL (ref 8.9–10.3)
Chloride: 103 mmol/L (ref 98–111)
Creatinine, Ser: 1.06 mg/dL — ABNORMAL HIGH (ref 0.44–1.00)
GFR, Estimated: >60 mL/min (ref >60)
Glucose, Bld: 107 mg/dL — ABNORMAL HIGH (ref 70–99)
Potassium: 4.3 mmol/L (ref 3.5–5.1)
Sodium: 136 mmol/L (ref 135–145)
Total Bilirubin: 0.7 mg/dL (ref 0.3–1.2)
Total Protein: 7.1 g/dL (ref 6.5–8.1)

## 2020-08-03 NOTE — Progress Notes (Signed)
Patient verified using two identifiers for virtual visit via telephone today.  Patient denies new problems/concerns today.   

## 2020-08-04 NOTE — Progress Notes (Addendum)
HEMATOLOGY-ONCOLOGY TeleHEALTH VISIT PROGRESS NOTE  I connected with Virginia Harris on 08/03/2020 at 3:00PM  by video enabled telemedicine visit and verified that I am speaking with the correct person using two identifiers. I discussed the limitations, risks, security and privacy concerns of performing an evaluation and management service by telemedicine and the availability of in-person appointments. The patient expressed understanding and agreed to proceed.   Other persons participating in the visit and their role in the encounter:  None  Patient's location: Home  Provider's location: office Chief Complaint: DVT   INTERVAL HISTORY Virginia Harris is a 39 y.o. female who has above history reviewed by me today presents for follow up visit for management of DVT Problems and complaints are listed below:  Patient will be finishing 3 months of Xarelto treatments.  She tolerates well.  No new complaints.  She reports that left lower extremity pain and swelling has completely resolved after being on Xarelto for months.  Denies any shortness of breath.  No bleeding events.  Review of Systems  Constitutional: Negative for appetite change, chills, fatigue and fever.  HENT:   Negative for hearing loss and voice change.   Eyes: Negative for eye problems.  Respiratory: Negative for chest tightness and cough.   Cardiovascular: Negative for chest pain.  Gastrointestinal: Negative for abdominal distention, abdominal pain and blood in stool.  Endocrine: Negative for hot flashes.  Genitourinary: Negative for difficulty urinating and frequency.   Musculoskeletal: Negative for arthralgias.  Skin: Negative for itching and rash.  Neurological: Negative for extremity weakness.  Hematological: Negative for adenopathy.  Psychiatric/Behavioral: Negative for confusion.    Past Medical History:  Diagnosis Date  . Asthma   . DVT (deep venous thrombosis) (HCC) 2021   Past Surgical History:  Procedure  Laterality Date  . CORNEAL TRANSPLANT Left 2010,2013    Family History  Problem Relation Age of Onset  . Breast cancer Maternal Grandmother     Social History   Socioeconomic History  . Marital status: Single    Spouse name: Not on file  . Number of children: 4  . Years of education: Not on file  . Highest education level: Not on file  Occupational History  . Not on file  Tobacco Use  . Smoking status: Never Smoker  . Smokeless tobacco: Never Used  Substance and Sexual Activity  . Alcohol use: Yes    Comment: occasional  . Drug use: No  . Sexual activity: Yes    Birth control/protection: None  Other Topics Concern  . Not on file  Social History Narrative  . Not on file   Social Determinants of Health   Financial Resource Strain:   . Difficulty of Paying Living Expenses: Not on file  Food Insecurity:   . Worried About Programme researcher, broadcasting/film/video in the Last Year: Not on file  . Ran Out of Food in the Last Year: Not on file  Transportation Needs:   . Lack of Transportation (Medical): Not on file  . Lack of Transportation (Non-Medical): Not on file  Physical Activity:   . Days of Exercise per Week: Not on file  . Minutes of Exercise per Session: Not on file  Stress:   . Feeling of Stress : Not on file  Social Connections:   . Frequency of Communication with Friends and Family: Not on file  . Frequency of Social Gatherings with Friends and Family: Not on file  . Attends Religious Services: Not on file  . Active Member  of Clubs or Organizations: Not on file  . Attends Banker Meetings: Not on file  . Marital Status: Not on file  Intimate Partner Violence:   . Fear of Current or Ex-Partner: Not on file  . Emotionally Abused: Not on file  . Physically Abused: Not on file  . Sexually Abused: Not on file    Current Outpatient Medications on File Prior to Visit  Medication Sig Dispense Refill  . clindamycin-benzoyl peroxide (BENZACLIN) gel Apply topically 2  (two) times daily. 25 g 0  . rivaroxaban (XARELTO) 20 MG TABS tablet Take 1 tablet (20 mg total) by mouth daily with supper. 30 tablet 1  . metaxalone (SKELAXIN) 800 MG tablet Take 1 tablet (800 mg total) by mouth 3 (three) times daily. (Patient not taking: Reported on 08/03/2020) 21 tablet 0   No current facility-administered medications on file prior to visit.    Allergies  Allergen Reactions  . Fruit Extracts Hives    strawberries       Observations/Objective: Today's Vitals   08/03/20 1424  PainSc: 0-No pain   There is no height or weight on file to calculate BMI.  Physical Exam Neurological:     Mental Status: She is alert.     CBC    Component Value Date/Time   WBC 8.6 08/03/2020 1026   RBC 4.43 08/03/2020 1026   HGB 12.9 08/03/2020 1026   HGB 10.9 (L) 11/24/2011 1421   HCT 38.8 08/03/2020 1026   HCT 26.7 (L) 11/26/2011 0610   PLT 375 08/03/2020 1026   PLT 276 11/24/2011 1421   MCV 87.6 08/03/2020 1026   MCV 85 11/24/2011 1421   MCH 29.1 08/03/2020 1026   MCHC 33.2 08/03/2020 1026   RDW 16.1 (H) 08/03/2020 1026   RDW 16.1 (H) 11/24/2011 1421   LYMPHSABS 2.3 08/03/2020 1026   LYMPHSABS 1.8 11/24/2011 1421   MONOABS 0.4 08/03/2020 1026   MONOABS 0.5 11/24/2011 1421   EOSABS 0.2 08/03/2020 1026   EOSABS 0.2 11/24/2011 1421   BASOSABS 0.0 08/03/2020 1026   BASOSABS 0.0 11/24/2011 1421    CMP     Component Value Date/Time   NA 136 08/03/2020 1026   K 4.3 08/03/2020 1026   CL 103 08/03/2020 1026   CO2 22 08/03/2020 1026   GLUCOSE 107 (H) 08/03/2020 1026   BUN 14 08/03/2020 1026   CREATININE 1.06 (H) 08/03/2020 1026   CALCIUM 10.2 08/03/2020 1026   PROT 7.1 08/03/2020 1026   ALBUMIN 3.6 08/03/2020 1026   AST 27 08/03/2020 1026   ALT 27 08/03/2020 1026   ALKPHOS 80 08/03/2020 1026   BILITOT 0.7 08/03/2020 1026   GFRNONAA >60 08/03/2020 1026   GFRAA >60 05/04/2020 1048     Assessment and Plan: 1. Acute deep vein thrombosis (DVT) of proximal vein  of left lower extremity (HCC)     Labs are reviewed and discussed with patient.  Clinically she is doing well and tolerates the anticoagulation.  She is going to finish 3 months anticoagulation with Xarelto and has a few tablets left.  I recommend patient to finish her supply, and then discontinue Her acute VTE is likely provoked due to Depo-Provera.  Therefore I recommend only 3 months of anticoagulation.   She is <50, I will check hypercoagulable work-up.-Factor V Leiden mutation negative and prothrombin gene mutation negative.  Will return to cancer center in 2 weeks to have blood work done.  Check antiphospholipid syndrome panel, protein C&S   Follow  Up Instructions: Assuming above testing are all negative, patient will be discharged from our clinic.  She agrees with the plan.   I discussed the assessment and treatment plan with the patient. The patient was provided an opportunity to ask questions and all were answered. The patient agreed with the plan and demonstrated an understanding of the instructions.  The patient was advised to call back or seek an in-person evaluation if the symptoms worsen or if the condition fails to improve as anticipated.    Rickard Patience, MD 08/04/2020 12:57 PM

## 2020-08-16 DIAGNOSIS — Z03818 Encounter for observation for suspected exposure to other biological agents ruled out: Secondary | ICD-10-CM | POA: Diagnosis not present

## 2020-08-17 ENCOUNTER — Other Ambulatory Visit: Payer: 59

## 2020-08-25 ENCOUNTER — Other Ambulatory Visit: Payer: Self-pay

## 2020-08-25 ENCOUNTER — Inpatient Hospital Stay: Payer: 59 | Attending: Oncology

## 2020-08-25 DIAGNOSIS — I824Y2 Acute embolism and thrombosis of unspecified deep veins of left proximal lower extremity: Secondary | ICD-10-CM | POA: Insufficient documentation

## 2020-08-25 LAB — ANTITHROMBIN III: AntiThromb III Func: 105 % (ref 75–120)

## 2020-08-27 LAB — ANTIPHOSPHOLIPID SYNDROME PROF
Anticardiolipin IgG: <9 GPL U/mL (ref 0–14)
Anticardiolipin IgM: <9 MPL U/mL (ref 0–12)
DRVVT: 33.4 s (ref 0.0–47.0)
PTT Lupus Anticoagulant: 26.6 s (ref 0.0–51.9)

## 2020-08-27 LAB — PROTEIN S, TOTAL AND FREE
Protein S Ag, Free: 157 % — ABNORMAL HIGH (ref 61–136)
Protein S Ag, Total: 124 % (ref 60–150)

## 2020-08-27 LAB — PROTEIN C ACTIVITY: Protein C Activity: 165 % (ref 73–180)

## 2020-08-31 ENCOUNTER — Encounter: Payer: Self-pay | Admitting: Oncology

## 2020-09-30 ENCOUNTER — Encounter: Payer: Self-pay | Admitting: Obstetrics and Gynecology

## 2020-09-30 ENCOUNTER — Other Ambulatory Visit: Payer: Self-pay | Admitting: Obstetrics and Gynecology

## 2020-09-30 ENCOUNTER — Other Ambulatory Visit: Payer: Self-pay

## 2020-09-30 ENCOUNTER — Ambulatory Visit (INDEPENDENT_AMBULATORY_CARE_PROVIDER_SITE_OTHER): Payer: 59 | Admitting: Obstetrics and Gynecology

## 2020-09-30 VITALS — BP 136/80 | HR 80 | Ht 65.0 in | Wt 250.0 lb

## 2020-09-30 DIAGNOSIS — N920 Excessive and frequent menstruation with regular cycle: Secondary | ICD-10-CM | POA: Diagnosis not present

## 2020-09-30 DIAGNOSIS — N946 Dysmenorrhea, unspecified: Secondary | ICD-10-CM | POA: Diagnosis not present

## 2020-09-30 HISTORY — PX: ENDOMETRIAL BIOPSY: PRO73

## 2020-09-30 NOTE — H&P (View-Only) (Signed)
Obstetrics and Gynecology H&P  Appointment Date: 09/30/2020  OBGYN Clinic: [facility]  Primary Care Provider: Lorre Harris  Referring Provider: Lorre Munroe, NP  Chief Complaint: menorrhagia, dysmenorrhea follow up  Interval History: Virginia Harris is a 40 y.o. African-American [id number] (09/20/20), seen for the above chief complaint. 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  Patient finished her last of her xarelto (3 month's worth) earlier this month; she had a period that lasted eight days and was heavy and painful, which is how they've been for the last few months.  She saw Heme and plan is for no more anti-coagulation and her hypercoag w/u was negative. Patient last seen by me in early November after review of u/s, which was negative. Periods at that time were not bad so plan was for expectant management and to follow up in a few months. Last sex 2 years ago  History of Present Illness: Patient was on depo provera for about two years and then came off b/c she heard it wasn't a good to stay on it for prolonged periods of time. She states she had no clotting issues during her pregnancies or when she was depo for those two years; last dose was June 2020. She restarted the depo provera on March 18, 2020. She presented to the urgent care on 8/24, the day after receiving her first pfizer covid shot, with right sided neck pain and left leg pain. 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  They felt the DVT was provoked by the depo provera and recommended 48m of the xarelto (consult visit on 8/31).   Review of Systems: 12 point ROS negative except per HPI.     Patient Active Problem List   Diagnosis Date Noted  . Menorrhagia with  regular cycle 07/07/2020  . AKI (acute kidney injury) (HCC) 06/15/2020  . History of DVT (deep vein thrombosis) 06/08/2020  . BMI 40.0-44.9, adult (HCC) 09/05/2016   Past Medical History:  Past Medical History:  Diagnosis Date  . Asthma   . DVT (deep venous thrombosis) (HCC) 2021    Past Surgical History:  Past Surgical History:  Procedure Laterality Date  . CORNEAL TRANSPLANT Left 2010,2013    Past Obstetrical History:  OB History  Gravida Para Term Preterm AB Living  5 4 4   1 4   SAB IAB Ectopic Multiple Live Births    1     4    # Outcome Date GA Lbr Len/2nd Weight Sex Delivery Anes PTL Lv  5 Term 11/25/11    F Vag-Spont EPI  LIV  4 Term 11/20/03    F Vag-Spont None  LIV  3 Term 07/16/02    F Vag-Spont EPI N LIV  2 Term 08/25/99    M Vag-Spont EPI  LIV  1 IAB 1999            Past Gynecological History: As per HPI. Periods: amenorrheic on depo provera. When off depo, she has heavy periods that last >1wk History of Pap Smear(s): Yes.   Last pap 2020, which was negative  Social History:  Social History   Socioeconomic History  . Marital status: Single    Spouse name: Not  on file  . Number of children: 4  . Years of education: Not on file  . Highest education level: Not on file  Occupational History  . Not on file  Tobacco Use  . Smoking status: Never Smoker  . Smokeless tobacco: Never Used  Substance and Sexual Activity  . Alcohol use: Yes    Comment: occasional  . Drug use: No  . Sexual activity: Yes    Birth control/protection: None  Other Topics Concern  . Not on file  Social History Narrative  . Not on file   Social Determinants of Health   Financial Resource Strain: Not on file  Food Insecurity: Not on file  Transportation Needs: Not on file  Physical Activity: Not on file  Stress: Not on file  Social Connections: Not on file  Intimate Partner Violence: Not on file    Family History:  Family History  Problem Relation Age of Onset  .  Breast cancer Maternal Grandmother     Medications Virginia Harris had no medications administered during this visit. Current Outpatient Medications  Medication Sig Dispense Refill  . clindamycin-benzoyl peroxide (BENZACLIN) gel Apply topically 2 (two) times daily. 25 g 0  . metaxalone (SKELAXIN) 800 MG tablet Take 1 tablet (800 mg total) by mouth 3 (three) times daily. 21 tablet 0   No current facility-administered medications for this visit.    Allergies Fruit extracts   Physical Exam:  BP 136/80   Pulse 80   Ht 5' 5" (1.651 m)   Wt 250 lb (113.4 kg)   BMI 41.60 kg/m  Body mass index is 41.6 kg/m. General appearance: Well nourished, well developed female in no acute distress.  Cardiovascular: normal s1 and s2.  No murmurs, rubs or gallops. Respiratory:  Clear to auscultation bilateral. Normal respiratory effort Abdomen: positive bowel sounds and no masses, hernias; diffusely non tender to palpation, non distended Neuro/Psych:  Normal mood and affect.  Skin:  Warm and dry.  Lymphatic:  No inguinal lymphadenopathy.   Pelvic exam: is not limited by body habitus EGBUS: within normal limits Vagina: within normal limits and with no blood or discharge in the vault Cervix: normal appearing cervix without tenderness, discharge or lesions. Uterus:  nonenlarged and non tender. Moderate decensus Adnexa:  normal adnexa and no mass, fullness, tenderness Rectovaginal: deferred  See procedure note for embx  Laboratory: none  Radiology:  Narrative & Impression  CLINICAL DATA:  Menorrhagia  EXAM: TRANSABDOMINAL AND TRANSVAGINAL ULTRASOUND OF PELVIS  TECHNIQUE: Both transabdominal and transvaginal ultrasound examinations of the pelvis were performed. Transabdominal technique was performed for global imaging of the pelvis including uterus, ovaries, adnexal regions, and pelvic cul-de-sac. It was necessary to proceed with endovaginal exam following the transabdominal exam to  visualize the uterus, endometrium, ovaries and adnexa.  COMPARISON:  None  FINDINGS: Uterus  Measurements: 8.8 x 4.5 x 5.7 cm = volume: 117 mL. No fibroids or other mass visualized.  Endometrium  Thickness: 4 mm in thickness.  No focal abnormality visualized.  Right ovary  Measurements: 3.0 x 1.6 x 2.1 cm = volume: 5.0 mL. Normal appearance/no adnexal mass.  Left ovary  Measurements: 2.1 x 2.1 x 1.8 cm = volume: 4.0 mL. Normal appearance/no adnexal mass.  Other findings  No abnormal free fluid.  IMPRESSION: Normal study.   Electronically Signed   By: Kevin  Dover M.D.   On: 07/02/2020 11:39     Assessment: pt stable  Plan:  1. Menorrhagia with regular cycle D/w her re:   options and only can be non hormone such as paragard (pt has used before and has potential for making periods worse), condoms, diaphragm or phexxi; pt is not in a long term relationship so vasectomy not an option.  I d/w her re: hyst and ablation. I told her that a hyst, which would be tried vaginally, can be considered in her case since she doesn't have an option for much medical therapy. I also d/w her re: ablation, potential for post ablation pain with tubal and risk of it wearing off, not working and other post op risks and with laparoscopy.   She would like to do ablation and btl  Will post for novasure ablation and BTL via filshie clips  CBC, cmp and EKG ordered.  - Surgical pathology( [place]/ [facility])  RTC post op  Cornelia Copa MD Attending [facility])

## 2020-09-30 NOTE — Progress Notes (Addendum)
Obstetrics and Gynecology Established Patient Evaluation  Appointment Date: 09/30/2020  OBGYN Clinic: [facility]  Primary Care Provider: Lorre Munroe  Referring Provider: Lorre Munroe, NP  Chief Complaint: menorrhagia, dysmenorrhea follow up  Interval History: Virginia Harris is a 40 y.o. African-American [id number] (09/20/20), seen for the above chief complaint. 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  Patient finished her last of her xarelto (3 month's worth) earlier this month; she had a period that lasted eight days and was heavy and painful, which is how they've been for the last few months.  She saw Heme and plan is for no more anti-coagulation and her hypercoag w/u was negative. Patient last seen by me in early November after review of u/s, which was negative. Periods at that time were not bad so plan was for expectant management and to follow up in a few months. Last sex 2 years ago  History of Present Illness: Patient was on depo provera for about two years and then came off b/c she heard it wasn't a good to stay on it for prolonged periods of time. She states she had no clotting issues during her pregnancies or when she was depo for those two years; last dose was June 2020. She restarted the depo provera on March 18, 2020. She presented to the urgent care on 8/24, the day after receiving her first pfizer covid shot, with right sided neck pain and left leg pain. 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  They felt the DVT was provoked by the depo provera and recommended 58m of the xarelto (consult visit on 8/31).   Review of Systems: 12 point ROS negative except per HPI.     Patient Active Problem List   Diagnosis Date  Noted  . Menorrhagia with regular cycle 07/07/2020  . AKI (acute kidney injury) (HCC) 06/15/2020  . History of DVT (deep vein thrombosis) 06/08/2020  . BMI 40.0-44.9, adult (HCC) 09/05/2016   Past Medical History:  Past Medical History:  Diagnosis Date  . Asthma   . DVT (deep venous thrombosis) (HCC) 2021    Past Surgical History:  Past Surgical History:  Procedure Laterality Date  . CORNEAL TRANSPLANT Left 2010,2013    Past Obstetrical History:  OB History  Gravida Para Term Preterm AB Living  5 4 4   1 4   SAB IAB Ectopic Multiple Live Births    1     4    # Outcome Date GA Lbr Len/2nd Weight Sex Delivery Anes PTL Lv  5 Term 11/25/11    F Vag-Spont EPI  LIV  4 Term 11/20/03    F Vag-Spont None  LIV  3 Term 07/16/02    F Vag-Spont EPI N LIV  2 Term 08/25/99    M Vag-Spont EPI  LIV  1 IAB 1999            Past Gynecological History: As per HPI. Periods: amenorrheic on depo provera. When off depo, she has heavy periods that last >1wk History of Pap Smear(s): Yes.   Last pap 2020, which was negative  Social History:  Social History   Socioeconomic History  . Marital status: Single    Spouse  name: Not on file  . Number of children: 4  . Years of education: Not on file  . Highest education level: Not on file  Occupational History  . Not on file  Tobacco Use  . Smoking status: Never Smoker  . Smokeless tobacco: Never Used  Substance and Sexual Activity  . Alcohol use: Yes    Comment: occasional  . Drug use: No  . Sexual activity: Yes    Birth control/protection: None  Other Topics Concern  . Not on file  Social History Narrative  . Not on file   Social Determinants of Health   Financial Resource Strain: Not on file  Food Insecurity: Not on file  Transportation Needs: Not on file  Physical Activity: Not on file  Stress: Not on file  Social Connections: Not on file  Intimate Partner Violence: Not on file    Family History:  Family History  Problem  Relation Age of Onset  . Breast cancer Maternal Grandmother     Medications Yolanda Bonine had no medications administered during this visit. Current Outpatient Medications  Medication Sig Dispense Refill  . clindamycin-benzoyl peroxide (BENZACLIN) gel Apply topically 2 (two) times daily. 25 g 0  . metaxalone (SKELAXIN) 800 MG tablet Take 1 tablet (800 mg total) by mouth 3 (three) times daily. 21 tablet 0   No current facility-administered medications for this visit.    Allergies Fruit extracts   Physical Exam:  BP 136/80   Pulse 80   Ht 5\' 5"  (1.651 m)   Wt 250 lb (113.4 kg)   BMI 41.60 kg/m  Body mass index is 41.6 kg/m. General appearance: Well nourished, well developed female in no acute distress.  Cardiovascular: normal s1 and s2.  No murmurs, rubs or gallops. Respiratory:  Clear to auscultation bilateral. Normal respiratory effort Abdomen: positive bowel sounds and no masses, hernias; diffusely non tender to palpation, non distended Neuro/Psych:  Normal mood and affect.  Skin:  Warm and dry.  Lymphatic:  No inguinal lymphadenopathy.   Pelvic exam: is not limited by body habitus EGBUS: within normal limits Vagina: within normal limits and with no blood or discharge in the vault Cervix: normal appearing cervix without tenderness, discharge or lesions. Uterus:  nonenlarged and non tender. Moderate decensus Adnexa:  normal adnexa and no mass, fullness, tenderness Rectovaginal: deferred  See procedure note for embx  Laboratory: none  Radiology:  Narrative & Impression  CLINICAL DATA:  Menorrhagia  EXAM: TRANSABDOMINAL AND TRANSVAGINAL ULTRASOUND OF PELVIS  TECHNIQUE: Both transabdominal and transvaginal ultrasound examinations of the pelvis were performed. Transabdominal technique was performed for global imaging of the pelvis including uterus, ovaries, adnexal regions, and pelvic cul-de-sac. It was necessary to proceed with endovaginal exam following the  transabdominal exam to visualize the uterus, endometrium, ovaries and adnexa.  COMPARISON:  None  FINDINGS: Uterus  Measurements: 8.8 x 4.5 x 5.7 cm = volume: 117 mL. No fibroids or other mass visualized.  Endometrium  Thickness: 4 mm in thickness.  No focal abnormality visualized.  Right ovary  Measurements: 3.0 x 1.6 x 2.1 cm = volume: 5.0 mL. Normal appearance/no adnexal mass.  Left ovary  Measurements: 2.1 x 2.1 x 1.8 cm = volume: 4.0 mL. Normal appearance/no adnexal mass.  Other findings  No abnormal free fluid.  IMPRESSION: Normal study.   Electronically Signed   By: M.D.   On: 07/02/2020 11:39     Assessment: pt stable  Plan:  1. Menorrhagia with regular cycle D/w  her re: options and only can be non hormone such as paragard (pt has used before and has potential for making periods worse), condoms, diaphragm or phexxi; pt is not in a long term relationship so vasectomy not an option.  I d/w her re: hyst and ablation. I told her that a hyst, which would be tried vaginally, can be considered in her case since she doesn't have an option for much medical therapy. I also d/w her re: ablation, potential for post ablation pain with tubal and risk of it wearing off, not working and other post op risks and with laparoscopy and BTL risks.   She would like to do ablation and btl  Will post for novasure ablation and BTL via filshie clips - Surgical pathology( [place]/ [facility])  RTC post op  Cornelia Copa MD Attending Center for [facility])

## 2020-09-30 NOTE — Procedures (Signed)
Endometrial Biopsy Procedure Note  Pre-operative Diagnosis: dysmenorrhea and menorrhagia  Post-operative Diagnosis: same  Procedure Details The risks (including infection, bleeding, pain, and uterine perforation) and benefits of the procedure were explained to the patient and Written informed consent was obtained.  The patient was placed in the dorsal lithotomy position.  Bimanual exam showed the uterus to be in the neutral position.  A Graves' speculum inserted in the vagina, and the cervix was visualized. The cervix was then prepped with povidone iodine, and a sharp tenaculum was applied to the anterior lip of the cervix for stabilization.  A pipelle was inserted into the uterine cavity and sounded the uterus to a depth of 8cm.  A Small amount of tissue was collected after 1 passes. The sample was sent for pathologic examination.  Condition: Stable  Complications: None  Plan: The patient was advised to call for any fever or for prolonged or severe pain or bleeding. She was advised to use OTC analgesics as needed for mild to moderate pain. She was advised to avoid vaginal intercourse for 48 hours or until the bleeding has completely stopped.  Cornelia Copa MD Attending Center for [facility])

## 2020-09-30 NOTE — H&P (Signed)
Obstetrics and Gynecology H&P  Appointment Date: 09/30/2020  OBGYN Clinic: [facility]  Primary Care Provider: Lorre Munroe  Referring Provider: Lorre Munroe, NP  Chief Complaint: menorrhagia, dysmenorrhea follow up  Interval History: Shannara Winbush is a 40 y.o. African-American [id number] (09/20/20), seen for the above chief complaint. 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  Patient finished her last of her xarelto (3 month's worth) earlier this month; she had a period that lasted eight days and was heavy and painful, which is how they've been for the last few months.  She saw Heme and plan is for no more anti-coagulation and her hypercoag w/u was negative. Patient last seen by me in early November after review of u/s, which was negative. Periods at that time were not bad so plan was for expectant management and to follow up in a few months. Last sex 2 years ago  History of Present Illness: Patient was on depo provera for about two years and then came off b/c she heard it wasn't a good to stay on it for prolonged periods of time. She states she had no clotting issues during her pregnancies or when she was depo for those two years; last dose was June 2020. She restarted the depo provera on March 18, 2020. She presented to the urgent care on 8/24, the day after receiving her first pfizer covid shot, with right sided neck pain and left leg pain. 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  They felt the DVT was provoked by the depo provera and recommended 48m of the xarelto (consult visit on 8/31).   Review of Systems: 12 point ROS negative except per HPI.     Patient Active Problem List   Diagnosis Date Noted  . Menorrhagia with  regular cycle 07/07/2020  . AKI (acute kidney injury) (HCC) 06/15/2020  . History of DVT (deep vein thrombosis) 06/08/2020  . BMI 40.0-44.9, adult (HCC) 09/05/2016   Past Medical History:  Past Medical History:  Diagnosis Date  . Asthma   . DVT (deep venous thrombosis) (HCC) 2021    Past Surgical History:  Past Surgical History:  Procedure Laterality Date  . CORNEAL TRANSPLANT Left 2010,2013    Past Obstetrical History:  OB History  Gravida Para Term Preterm AB Living  5 4 4   1 4   SAB IAB Ectopic Multiple Live Births    1     4    # Outcome Date GA Lbr Len/2nd Weight Sex Delivery Anes PTL Lv  5 Term 11/25/11    F Vag-Spont EPI  LIV  4 Term 11/20/03    F Vag-Spont None  LIV  3 Term 07/16/02    F Vag-Spont EPI N LIV  2 Term 08/25/99    M Vag-Spont EPI  LIV  1 IAB 1999            Past Gynecological History: As per HPI. Periods: amenorrheic on depo provera. When off depo, she has heavy periods that last >1wk History of Pap Smear(s): Yes.   Last pap 2020, which was negative  Social History:  Social History   Socioeconomic History  . Marital status: Single    Spouse name: Not  on file  . Number of children: 4  . Years of education: Not on file  . Highest education level: Not on file  Occupational History  . Not on file  Tobacco Use  . Smoking status: Never Smoker  . Smokeless tobacco: Never Used  Substance and Sexual Activity  . Alcohol use: Yes    Comment: occasional  . Drug use: No  . Sexual activity: Yes    Birth control/protection: None  Other Topics Concern  . Not on file  Social History Narrative  . Not on file   Social Determinants of Health   Financial Resource Strain: Not on file  Food Insecurity: Not on file  Transportation Needs: Not on file  Physical Activity: Not on file  Stress: Not on file  Social Connections: Not on file  Intimate Partner Violence: Not on file    Family History:  Family History  Problem Relation Age of Onset  .  Breast cancer Maternal Grandmother     Medications Yolanda Bonine had no medications administered during this visit. Current Outpatient Medications  Medication Sig Dispense Refill  . clindamycin-benzoyl peroxide (BENZACLIN) gel Apply topically 2 (two) times daily. 25 g 0  . metaxalone (SKELAXIN) 800 MG tablet Take 1 tablet (800 mg total) by mouth 3 (three) times daily. 21 tablet 0   No current facility-administered medications for this visit.    Allergies Fruit extracts   Physical Exam:  BP 136/80   Pulse 80   Ht 5\' 5"  (1.651 m)   Wt 250 lb (113.4 kg)   BMI 41.60 kg/m  Body mass index is 41.6 kg/m. General appearance: Well nourished, well developed female in no acute distress.  Cardiovascular: normal s1 and s2.  No murmurs, rubs or gallops. Respiratory:  Clear to auscultation bilateral. Normal respiratory effort Abdomen: positive bowel sounds and no masses, hernias; diffusely non tender to palpation, non distended Neuro/Psych:  Normal mood and affect.  Skin:  Warm and dry.  Lymphatic:  No inguinal lymphadenopathy.   Pelvic exam: is not limited by body habitus EGBUS: within normal limits Vagina: within normal limits and with no blood or discharge in the vault Cervix: normal appearing cervix without tenderness, discharge or lesions. Uterus:  nonenlarged and non tender. Moderate decensus Adnexa:  normal adnexa and no mass, fullness, tenderness Rectovaginal: deferred  See procedure note for embx  Laboratory: none  Radiology:  Narrative & Impression  CLINICAL DATA:  Menorrhagia  EXAM: TRANSABDOMINAL AND TRANSVAGINAL ULTRASOUND OF PELVIS  TECHNIQUE: Both transabdominal and transvaginal ultrasound examinations of the pelvis were performed. Transabdominal technique was performed for global imaging of the pelvis including uterus, ovaries, adnexal regions, and pelvic cul-de-sac. It was necessary to proceed with endovaginal exam following the transabdominal exam to  visualize the uterus, endometrium, ovaries and adnexa.  COMPARISON:  None  FINDINGS: Uterus  Measurements: 8.8 x 4.5 x 5.7 cm = volume: 117 mL. No fibroids or other mass visualized.  Endometrium  Thickness: 4 mm in thickness.  No focal abnormality visualized.  Right ovary  Measurements: 3.0 x 1.6 x 2.1 cm = volume: 5.0 mL. Normal appearance/no adnexal mass.  Left ovary  Measurements: 2.1 x 2.1 x 1.8 cm = volume: 4.0 mL. Normal appearance/no adnexal mass.  Other findings  No abnormal free fluid.  IMPRESSION: Normal study.   Electronically Signed   By: M.D.   On: 07/02/2020 11:39     Assessment: pt stable  Plan:  1. Menorrhagia with regular cycle D/w her re:  options and only can be non hormone such as paragard (pt has used before and has potential for making periods worse), condoms, diaphragm or phexxi; pt is not in a long term relationship so vasectomy not an option.  I d/w her re: hyst and ablation. I told her that a hyst, which would be tried vaginally, can be considered in her case since she doesn't have an option for much medical therapy. I also d/w her re: ablation, potential for post ablation pain with tubal and risk of it wearing off, not working and other post op risks and with laparoscopy.   She would like to do ablation and btl  Will post for novasure ablation and BTL via filshie clips  CBC, cmp and EKG ordered.  - Surgical pathology( [place]/ [facility])  RTC post op  Cornelia Copa MD Attending Center for [facility])

## 2020-09-30 NOTE — Addendum Note (Signed)
Addended by: [place] Bing on: 09/30/2020 06:40 PM   Modules accepted: Orders, SmartSet

## 2020-10-06 ENCOUNTER — Encounter (HOSPITAL_BASED_OUTPATIENT_CLINIC_OR_DEPARTMENT_OTHER): Payer: Self-pay | Admitting: Obstetrics and Gynecology

## 2020-10-11 ENCOUNTER — Other Ambulatory Visit
Admission: RE | Admit: 2020-10-11 | Discharge: 2020-10-11 | Disposition: A | Discharge status: Home / Self Care | Payer: 59 | Source: Ambulatory Visit | Attending: Obstetrics and Gynecology | Admitting: Obstetrics and Gynecology

## 2020-10-11 ENCOUNTER — Encounter (HOSPITAL_BASED_OUTPATIENT_CLINIC_OR_DEPARTMENT_OTHER)
Admission: RE | Admit: 2020-10-11 | Discharge: 2020-10-11 | Disposition: A | Discharge status: Home / Self Care | Payer: 59 | Source: Ambulatory Visit | Attending: Obstetrics and Gynecology | Admitting: Obstetrics and Gynecology

## 2020-10-11 ENCOUNTER — Other Ambulatory Visit: Payer: Self-pay

## 2020-10-11 DIAGNOSIS — Z20822 Contact with and (suspected) exposure to covid-19: Secondary | ICD-10-CM | POA: Insufficient documentation

## 2020-10-11 DIAGNOSIS — Z01812 Encounter for preprocedural laboratory examination: Secondary | ICD-10-CM | POA: Insufficient documentation

## 2020-10-11 LAB — CBC
HCT: 40.7 % (ref 36.0–46.0)
Hemoglobin: 13.9 g/dL (ref 12.0–15.0)
MCH: 30.6 pg (ref 26.0–34.0)
MCHC: 34.2 g/dL (ref 30.0–36.0)
MCV: 89.6 fL (ref 80.0–100.0)
Platelets: 352 10*3/uL (ref 150–400)
RBC: 4.54 MIL/uL (ref 3.87–5.11)
RDW: 15.4 % (ref 11.5–15.5)
WBC: 7.2 10*3/uL (ref 4.0–10.5)
nRBC: 0 % (ref 0.0–0.2)

## 2020-10-11 LAB — COMPREHENSIVE METABOLIC PANEL
ALT: 25 U/L (ref 0–44)
AST: 30 U/L (ref 15–41)
Albumin: 3.7 g/dL (ref 3.5–5.0)
Alkaline Phosphatase: 66 U/L (ref 38–126)
Anion gap: 10 (ref 5–15)
BUN: 7 mg/dL (ref 6–20)
CO2: 21 mmol/L — ABNORMAL LOW (ref 22–32)
Calcium: 10.4 mg/dL — ABNORMAL HIGH (ref 8.9–10.3)
Chloride: 107 mmol/L (ref 98–111)
Creatinine, Ser: 1.16 mg/dL — ABNORMAL HIGH (ref 0.44–1.00)
GFR, Estimated: >60 mL/min (ref >60)
Glucose, Bld: 96 mg/dL (ref 70–99)
Potassium: 4.3 mmol/L (ref 3.5–5.1)
Sodium: 138 mmol/L (ref 135–145)
Total Bilirubin: 0.7 mg/dL (ref 0.3–1.2)
Total Protein: 6.7 g/dL (ref 6.5–8.1)

## 2020-10-11 LAB — POCT PREGNANCY, URINE: Preg Test, Ur: NEGATIVE

## 2020-10-12 LAB — SARS CORONAVIRUS 2 (TAT 6-24 HRS): SARS Coronavirus 2: NEGATIVE

## 2020-10-13 ENCOUNTER — Ambulatory Visit (HOSPITAL_BASED_OUTPATIENT_CLINIC_OR_DEPARTMENT_OTHER)
Admission: RE | Admit: 2020-10-13 | Discharge: 2020-10-13 | Disposition: A | Discharge status: Home / Self Care | Payer: 59 | Attending: Obstetrics and Gynecology | Admitting: Obstetrics and Gynecology

## 2020-10-13 ENCOUNTER — Encounter (HOSPITAL_BASED_OUTPATIENT_CLINIC_OR_DEPARTMENT_OTHER)
Admission: RE | Disposition: A | Discharge status: Home / Self Care | Payer: Self-pay | Source: Home / Self Care | Attending: Obstetrics and Gynecology

## 2020-10-13 ENCOUNTER — Encounter (HOSPITAL_BASED_OUTPATIENT_CLINIC_OR_DEPARTMENT_OTHER): Payer: Self-pay | Admitting: Obstetrics and Gynecology

## 2020-10-13 ENCOUNTER — Other Ambulatory Visit (HOSPITAL_BASED_OUTPATIENT_CLINIC_OR_DEPARTMENT_OTHER): Payer: Self-pay | Admitting: Obstetrics and Gynecology

## 2020-10-13 ENCOUNTER — Ambulatory Visit (HOSPITAL_BASED_OUTPATIENT_CLINIC_OR_DEPARTMENT_OTHER): Payer: 59 | Admitting: Certified Registered Nurse Anesthetist

## 2020-10-13 ENCOUNTER — Other Ambulatory Visit: Payer: Self-pay

## 2020-10-13 DIAGNOSIS — Z947 Corneal transplant status: Secondary | ICD-10-CM | POA: Diagnosis not present

## 2020-10-13 DIAGNOSIS — Z302 Encounter for sterilization: Secondary | ICD-10-CM | POA: Insufficient documentation

## 2020-10-13 DIAGNOSIS — N946 Dysmenorrhea, unspecified: Secondary | ICD-10-CM | POA: Insufficient documentation

## 2020-10-13 DIAGNOSIS — N920 Excessive and frequent menstruation with regular cycle: Secondary | ICD-10-CM | POA: Insufficient documentation

## 2020-10-13 DIAGNOSIS — Z86718 Personal history of other venous thrombosis and embolism: Secondary | ICD-10-CM | POA: Insufficient documentation

## 2020-10-13 DIAGNOSIS — Z9889 Other specified postprocedural states: Secondary | ICD-10-CM

## 2020-10-13 HISTORY — PX: LAPAROSCOPIC TUBAL LIGATION: SHX1937

## 2020-10-13 HISTORY — PX: HYSTEROSCOPY WITH NOVASURE: SHX5574

## 2020-10-13 SURGERY — HYSTEROSCOPY WITH NOVASURE
Anesthesia: General | Site: Uterus

## 2020-10-13 MED ORDER — SCOPOLAMINE 1 MG/3DAYS TD PT72
MEDICATED_PATCH | TRANSDERMAL | Status: AC
  Filled 2020-10-13: qty 1

## 2020-10-13 MED ORDER — OXYCODONE-ACETAMINOPHEN 5-325 MG PO TABS: 1.0000 | ORAL_TABLET | Freq: Four times a day (QID) | ORAL | 0 refills | Status: DC | PRN

## 2020-10-13 MED ORDER — LIDOCAINE 2% (20 MG/ML) 5 ML SYRINGE
INTRAMUSCULAR | Status: AC
  Filled 2020-10-13: qty 5

## 2020-10-13 MED ORDER — MIDAZOLAM HCL 2 MG/2ML IJ SOLN
INTRAMUSCULAR | Status: AC
  Filled 2020-10-13: qty 2

## 2020-10-13 MED ORDER — LACTATED RINGERS IV SOLN: INTRAVENOUS | Status: DC

## 2020-10-13 MED ORDER — FENTANYL CITRATE (PF) 100 MCG/2ML IJ SOLN
INTRAMUSCULAR | Status: DC | PRN
  Administered 2020-10-13: 50 ug via INTRAVENOUS
  Administered 2020-10-13: 100 ug via INTRAVENOUS
  Administered 2020-10-13: 50 ug via INTRAVENOUS

## 2020-10-13 MED ORDER — ONDANSETRON HCL 4 MG/2ML IJ SOLN
INTRAMUSCULAR | Status: AC
  Filled 2020-10-13: qty 2

## 2020-10-13 MED ORDER — SODIUM CHLORIDE 0.9 % IR SOLN
Status: DC | PRN
  Administered 2020-10-13: 115 mL

## 2020-10-13 MED ORDER — ACETAMINOPHEN 10 MG/ML IV SOLN
1000.0000 mg | Freq: Once | INTRAVENOUS | Status: AC
  Administered 2020-10-13: 1000 mg via INTRAVENOUS

## 2020-10-13 MED ORDER — ACETAMINOPHEN 10 MG/ML IV SOLN
INTRAVENOUS | Status: AC
  Filled 2020-10-13: qty 100

## 2020-10-13 MED ORDER — MIDAZOLAM HCL 5 MG/5ML IJ SOLN
INTRAMUSCULAR | Status: DC | PRN
  Administered 2020-10-13: 2 mg via INTRAVENOUS

## 2020-10-13 MED ORDER — ROCURONIUM BROMIDE 10 MG/ML (PF) SYRINGE
PREFILLED_SYRINGE | INTRAVENOUS | Status: AC
  Filled 2020-10-13: qty 10

## 2020-10-13 MED ORDER — DEXAMETHASONE SODIUM PHOSPHATE 10 MG/ML IJ SOLN
INTRAMUSCULAR | Status: AC
  Filled 2020-10-13: qty 1

## 2020-10-13 MED ORDER — LIDOCAINE HCL 1 % IJ SOLN
INTRAMUSCULAR | Status: DC | PRN
  Administered 2020-10-13: 20 mL

## 2020-10-13 MED ORDER — BUPIVACAINE HCL (PF) 0.5 % IJ SOLN
INTRAMUSCULAR | Status: AC
  Filled 2020-10-13: qty 30

## 2020-10-13 MED ORDER — FENTANYL CITRATE (PF) 100 MCG/2ML IJ SOLN
INTRAMUSCULAR | Status: AC
  Filled 2020-10-13: qty 2

## 2020-10-13 MED ORDER — OXYCODONE HCL 5 MG PO TABS
5.0000 mg | ORAL_TABLET | Freq: Once | ORAL | Status: AC | PRN
Start: 2020-10-13 — End: 2020-10-13

## 2020-10-13 MED ORDER — HYDROMORPHONE HCL 1 MG/ML IJ SOLN
0.5000 mg | INTRAMUSCULAR | Status: DC | PRN
  Administered 2020-10-13: 0.5 mg via INTRAVENOUS

## 2020-10-13 MED ORDER — OXYCODONE HCL 5 MG/5ML PO SOLN
5.0000 mg | Freq: Once | ORAL | Status: AC | PRN
  Administered 2020-10-13: 5 mg via ORAL

## 2020-10-13 MED ORDER — ROCURONIUM BROMIDE 100 MG/10ML IV SOLN
INTRAVENOUS | Status: DC | PRN
  Administered 2020-10-13: 60 mg via INTRAVENOUS

## 2020-10-13 MED ORDER — SILVER NITRATE-POT NITRATE 75-25 % EX MISC
CUTANEOUS | Status: AC
  Filled 2020-10-13: qty 10

## 2020-10-13 MED ORDER — DEXAMETHASONE SODIUM PHOSPHATE 10 MG/ML IJ SOLN
INTRAMUSCULAR | Status: DC | PRN
  Administered 2020-10-13: 10 mg via INTRAVENOUS

## 2020-10-13 MED ORDER — ONDANSETRON HCL 4 MG/2ML IJ SOLN: 4.0000 mg | Freq: Once | INTRAMUSCULAR | Status: DC | PRN

## 2020-10-13 MED ORDER — LIDOCAINE HCL (CARDIAC) PF 100 MG/5ML IV SOSY
PREFILLED_SYRINGE | INTRAVENOUS | Status: DC | PRN
  Administered 2020-10-13: 80 mg via INTRAVENOUS

## 2020-10-13 MED ORDER — LIDOCAINE HCL (PF) 1 % IJ SOLN
INTRAMUSCULAR | Status: AC
  Filled 2020-10-13: qty 30

## 2020-10-13 MED ORDER — SUGAMMADEX SODIUM 200 MG/2ML IV SOLN
INTRAVENOUS | Status: DC | PRN
  Administered 2020-10-13: 200 mg via INTRAVENOUS

## 2020-10-13 MED ORDER — SCOPOLAMINE 1 MG/3DAYS TD PT72
1.0000 | MEDICATED_PATCH | TRANSDERMAL | Status: DC
  Administered 2020-10-13: 1.5 mg via TRANSDERMAL

## 2020-10-13 MED ORDER — ONDANSETRON HCL 4 MG/2ML IJ SOLN
INTRAMUSCULAR | Status: DC | PRN
  Administered 2020-10-13: 4 mg via INTRAVENOUS

## 2020-10-13 MED ORDER — BUPIVACAINE HCL 0.5 % IJ SOLN
INTRAMUSCULAR | Status: DC | PRN
  Administered 2020-10-13: 18 mL

## 2020-10-13 MED ORDER — HYDROMORPHONE HCL 1 MG/ML IJ SOLN
INTRAMUSCULAR | Status: AC
  Filled 2020-10-13: qty 0.5

## 2020-10-13 MED ORDER — FENTANYL CITRATE (PF) 100 MCG/2ML IJ SOLN
25.0000 ug | INTRAMUSCULAR | Status: DC | PRN
Start: 2020-10-13 — End: 2020-10-13
  Administered 2020-10-13 (×2): 50 ug via INTRAVENOUS

## 2020-10-13 MED ORDER — PROPOFOL 10 MG/ML IV BOLUS
INTRAVENOUS | Status: DC | PRN
  Administered 2020-10-13: 180 mg via INTRAVENOUS

## 2020-10-13 MED ORDER — OXYCODONE HCL 5 MG/5ML PO SOLN
ORAL | Status: AC
  Filled 2020-10-13: qty 5

## 2020-10-13 SURGICAL SUPPLY — 40 items
ABLATOR SURESOUND NOVASURE (ABLATOR) ×4 IMPLANT
ADH SKN CLS APL DERMABOND .7 (GAUZE/BANDAGES/DRESSINGS) ×3
BLADE SURG 15 STRL LF DISP TIS (BLADE) ×3 IMPLANT
BLADE SURG 15 STRL SS (BLADE) ×4
CATH ROBINSON RED A/P 14FR (CATHETERS) IMPLANT
CATH ROBINSON RED A/P 16FR (CATHETERS) IMPLANT
CLIP FILSHIE TUBAL LIGA STRL (Clip) ×4 IMPLANT
DERMABOND ADVANCED (GAUZE/BANDAGES/DRESSINGS) ×1
DERMABOND ADVANCED .7 DNX12 (GAUZE/BANDAGES/DRESSINGS) ×3 IMPLANT
DRSG OPSITE POSTOP 3X4 (GAUZE/BANDAGES/DRESSINGS) ×4 IMPLANT
DURAPREP 26ML APPLICATOR (WOUND CARE) ×4 IMPLANT
GAUZE 4X4 16PLY RFD (DISPOSABLE) ×4 IMPLANT
GLOVE SURG POLYISO LF SZ7 (GLOVE) ×4 IMPLANT
GLOVE SURG UNDER POLY LF SZ7 (GLOVE) ×8 IMPLANT
GLOVE SURG UNDER POLY LF SZ7.5 (GLOVE) ×8 IMPLANT
GOWN STRL REUS W/ TWL LRG LVL3 (GOWN DISPOSABLE) ×3 IMPLANT
GOWN STRL REUS W/TWL 2XL LVL3 (GOWN DISPOSABLE) ×4 IMPLANT
GOWN STRL REUS W/TWL LRG LVL3 (GOWN DISPOSABLE) ×8 IMPLANT
GOWN STRL REUS W/TWL XL LVL3 (GOWN DISPOSABLE) ×4 IMPLANT
HANDPIECE ABLA MINERVA ENDO (MISCELLANEOUS) IMPLANT
KIT PROCEDURE FLUENT (KITS) ×4 IMPLANT
NEEDLE INSUFFLATION 14GA 120MM (NEEDLE) IMPLANT
NEEDLE INSUFFLATION 150MM (ENDOMECHANICALS) IMPLANT
PACK LAPAROSCOPY BASIN (CUSTOM PROCEDURE TRAY) ×4 IMPLANT
PACK TRENDGUARD 450 HYBRID PRO (MISCELLANEOUS) ×3 IMPLANT
PACK VAGINAL MINOR WOMEN LF (CUSTOM PROCEDURE TRAY) ×4 IMPLANT
PAD OB MATERNITY 4.3X12.25 (PERSONAL CARE ITEMS) ×4 IMPLANT
PAD PREP 24X48 CUFFED NSTRL (MISCELLANEOUS) ×4 IMPLANT
SET TUBE SMOKE EVAC HIGH FLOW (TUBING) ×4 IMPLANT
SLEEVE SCD COMPRESS KNEE MED (MISCELLANEOUS) ×4 IMPLANT
SUT MNCRL AB 4-0 PS2 18 (SUTURE) ×4 IMPLANT
SUT VICRYL 0 UR6 27IN ABS (SUTURE) ×4 IMPLANT
TOWEL GREEN STERILE FF (TOWEL DISPOSABLE) ×8 IMPLANT
TRENDGUARD 450 HYBRID PRO PACK (MISCELLANEOUS) ×4
TROCAR 5M 150ML BLDLS (TROCAR) IMPLANT
TROCAR ADV FIXATION 5X100MM (TROCAR) IMPLANT
TROCAR BALLN 12MMX100 BLUNT (TROCAR) ×4 IMPLANT
TROCAR BALLN GELPORT 12X130M (ENDOMECHANICALS) IMPLANT
TROCAR XCEL NON-BLD 11X100MML (ENDOMECHANICALS) IMPLANT
WARMER LAPAROSCOPE (MISCELLANEOUS) ×4 IMPLANT

## 2020-10-13 NOTE — Interval H&P Note (Signed)
History and Physical Interval Note:  10/13/2020 8:56 AM  Virginia Harris  has presented today for surgery, with the diagnosis of AUB Undesired Fertility.  The various methods of treatment have been discussed with the patient and family. After consideration of risks, benefits and other options for treatment, the patient has consented to  Procedure(s): DILATATION & CURETTAGE/HYSTEROSCOPY WITH NOVASURE ABLATION (N/A) LAPAROSCOPIC TUBAL LIGATION (Bilateral) as a surgical intervention.  The patient's history has been reviewed, patient examined, no change in status, stable for surgery.  I have reviewed the patient's chart and labs.  Questions were answered to the patient's satisfaction.     [name]

## 2020-10-13 NOTE — Transfer of Care (Signed)
Immediate Anesthesia Transfer of Care Note  Patient: Greenly Rarick  Procedure(s) Performed: HYSTEROSCOPY WITH NOVASURE (N/A Uterus) LAPAROSCOPIC TUBAL LIGATION (Bilateral Abdomen)  Patient Location: PACU  Anesthesia Type:General  Level of Consciousness: drowsy, patient cooperative and responds to stimulation  Airway & Oxygen Therapy: Patient Spontanous Breathing and Patient connected to face mask oxygen  Post-op Assessment: Report given to RN and Post -op Vital signs reviewed and stable  Post vital signs: Reviewed and stable  Last Vitals:  Vitals Value Taken Time  BP    Temp    Pulse 85 10/13/20 1123  Resp 22 10/13/20 1123  SpO2 98 % 10/13/20 1123  Vitals shown include unvalidated device data.  Last Pain:  Vitals:   10/13/20 0845  TempSrc: Oral  PainSc: 0-No pain         Complications: No complications documented.

## 2020-10-13 NOTE — Op Note (Signed)
Operative Note   10/13/2020  PRE-OP DIAGNOSIS: menorrhagia, dysmenorrhea. Desire for permanent sterilization   POST-OP DIAGNOSIS: Same   SURGEON: Surgeon(s) and Role:    * [place] Bing, MD - Primary  ASSISTANT: None  PROCEDURE: Hysteroscopy with Novasure endometrial ablation and laparoscopic bilateral tubal ligation via Filshie clips  ANESTHESIA: General and local   ESTIMATED BLOOD LOSS: 38mL  DRAINS: None   TOTAL IV FLUIDS: per anesthesia records  SPECIMENS: None  VTE PROPHYLAXIS: SCDs to the bilateral lower extremities  ANTIBIOTICS: Not indicated  FLUID DEFICIT: 62mL  COMPLICATIONS: None  DISPOSITION: PACU - hemodynamically stable.  CONDITION: stable  FINDINGS: Exam under anesthesia revealed small, mobile mid plane uterus with no masses and bilateral adnexa without masses or fullness. Post ablation hysteroscopy revealed normal appearing cavity and bilateral tubal ostia. Complete burn over at least 95% of cavity with small pink area near the right cornua noted.  No intra abdominal adhesions noted with normal liver edge, gallbladder edge, stomach edge and  grossly normal uterus, tubes and ovaries.  PROCEDURE IN DETAIL:  After informed consent was obtained, the patient was taken to the operating room where anesthesia was obtained without difficulty. The patient was positioned in the dorsal lithotomy position in [place] stirrups.  The patient had just voided.  The patient was examined under anesthesia, with the above noted findings.  Patient placed in trendelenburg, and the bi-valved speculum was placed inside the patient's vagina, and the the anterior lip of the cervix was seen and grasped with the tenaculum.  A paracervical block was achieved with 79mL 1% lidocaine.  The uterine cavity was sounded to 10cm, and SureSound device was used to calculate cavity length at 5.5cm. The cervix was progressively dilated to a 19French-Pratt dilator. The novasure ablation was then done per  manufacturer's specifications with total time of 90 seconds, cavity width of 3.8cm and power of 115 Watts. Post ablation hysteroscopy with the above noted findings.  The hystersocope and all instruments removed and speculum inserted with excellent hemostasis noted. Hulka manipulator was then placed and tenaculum removed; patient was then flattened.  Gloves changed and the umbilicus was then injected with 0.5% marcaine and using the open technique the abdomen was entered and a 83mm balloon trocar placed with gas to . Operative laparoscope introduced and no injury noted below and patient with the above noted findings.  She was then placed in trendelenburg and using a blunt probe the tubes were traced out the fimbriae.  Filshie clips were then placed over the mid point of each tube.  Laparoscope was then removed and the abdomen desufflated. The fascia was then closed with 0-vicryl in a figure of eight fashion and the skin closed with 4-0 monocryl and dermabond.   Hulka removed and speculum placed and cervix hemostatic.She was then taken out of dorsal lithotomy. The patient tolerated the procedure well.  Sponge, lap and instrument counts were correct x2.  The patient was taken to recovery room in excellent condition.  Cornelia Copa MD Attending Center for [facility])

## 2020-10-13 NOTE — Anesthesia Postprocedure Evaluation (Signed)
Anesthesia Post Note  Patient: Virginia Harris  Procedure(s) Performed: HYSTEROSCOPY WITH NOVASURE (N/A Uterus) LAPAROSCOPIC TUBAL LIGATION (Bilateral Abdomen)     Patient location during evaluation: PACU Anesthesia Type: General Level of consciousness: awake and alert and oriented Pain management: pain level controlled Vital Signs Assessment: post-procedure vital signs reviewed and stable Respiratory status: spontaneous breathing, nonlabored ventilation and respiratory function stable Cardiovascular status: blood pressure returned to baseline and stable Postop Assessment: no apparent nausea or vomiting Anesthetic complications: no   No complications documented.  Last Vitals:  Vitals:   10/13/20 1200 10/13/20 1230  BP: 110/71 114/68  Pulse: 69 74  Resp: 15 14  Temp:    SpO2: 100% 96%    Last Pain:  Vitals:   10/13/20 1228  TempSrc:   PainSc: 8                  Kenyetta Fife A.

## 2020-10-13 NOTE — Anesthesia Procedure Notes (Signed)
Procedure Name: Intubation Date/Time: 10/13/2020 10:15 AM Performed by: [place] Roniel Halloran C, CRNA Pre-anesthesia Checklist: Patient identified, Emergency Drugs available, Suction available and Patient being monitored Patient Re-evaluated:Patient Re-evaluated prior to induction Oxygen Delivery Method: Circle system utilized Preoxygenation: Pre-oxygenation with 100% oxygen Induction Type: IV induction Ventilation: Mask ventilation without difficulty Laryngoscope Size: Mac and 3 Grade View: Grade I Tube type: Oral Tube size: 7.0 mm Number of attempts: 1 Airway Equipment and Method: Stylet and Oral airway Placement Confirmation: ETT inserted through vocal cords under direct vision,  positive ETCO2 and breath sounds checked- equal and bilateral Secured at: 21 cm Tube secured with: Tape Dental Injury: Teeth and Oropharynx as per pre-operative assessment

## 2020-10-13 NOTE — Discharge Instructions (Addendum)
Laparoscopic Surgery Discharge Instructions  You have just undergone alaparoscopic surgery.  The following list should answer your most common questions.  Although we will discuss your surgery and post-operative instructions with you prior to your discharge, this list will serve as a reminder if you fail to recall the details of what we discussed.  We will discuss your surgery once again in detail at your post-op visit in two to four weeks. If you havent already done so, please call to make your appointment as soon as possible.  How you will feel: Although you have just undergone a major surgery, your recovery will be significantly shorter since the surgery was performed through much smaller incisions than the traditional approach.  You should feel slightly better each day.  If you suddenly feel much worse than the prior day, please call the clinic.  Its important during the early part of your recovery that you maintain some activity.  Walking is encouraged.  You will quicken your recovery by continued activity.  Incision:  Your incisions will be closed with dissolvable stitches or surgical adhesive (glue).  There may be Band-aids and/or Steri-strips covering your incisions.  If there is no drainage from the incisions you may remove the Band-aids in one to two days.  You may notice some minor bruising at the incision sites.  This is common and will resolve within several days.  Please inform us if the redness at the edges of your incision appears to be spreading.  If the skin around your incision becomes warm to the touch, or if you notice a pus-like drainage, please call the office.  Stairs/Driving/Activities: You may climb stairs if necessary.  If youve had general anesthesia, do not drive a car the rest of the day today.  You may begin light housework when you feel up to it, but avoid heavy lifting (more than 15-20lbs) or pushing until cleared for these activities by your physician.  Hygiene:  Do  not soak your incisions.  Showers are acceptable but you may not take a bath or swim in a pool.  Cleanse your incisions daily with soap and water.  Medications:  Please resume taking any medications that you were taking prior to the surgery.  If we have prescribed any new medications for you, please take them as directed.  Constipation:  It is fairly common to experience some difficulty in moving your bowels following major surgery.  Being active will help to reduce this likelihood. A diet rich in fiber and plenty of liquids is desirable.  If you do become constipated, a mild laxative such as Miralax, Milk of Magnesia, or Metamucil, or a stool softener such as Colace, is recommended.  General Instructions: If you develop a fever of 100.5 degrees or higher, please call the office number(s) below for physician on call.     We will discuss your surgery once again in detail at your post-op visit in two to four weeks. If you havent already done so, please call to make your appointment as soon as possible.  HOME CARE  Do not drive for 24 hours.  Wait 1 week before doing any activities that wear you out.  Do not stand for a long time.  Limit stair climbing to once or twice a day.  Rest often.  Continue with your usual diet.  Drink enough fluids to keep your pee (urine) clear or pale yellow.  If you have a hard time pooping (constipation), you may:  Take a medicine to  help you go poop (laxative) as told by your doctor.  Eat more fruit and bran.  Drink more fluids.  Take showers, not baths, for as long as told by your doctor.  Do not swim or use a hot tub until your doctor says it is okay.  Have someone with you for 1day after the procedure.  Do not douche, use tampons, or have sex (intercourse) until seen by your doctor  Only take medicines as told by your doctor. Do not take aspirin. It can cause bleeding.  Keep all doctor visits. GET HELP IF:  You have cramps or pain not  helped by medicine.  You have new pain in the belly (abdomen).  You have a bad smelling fluid coming from your vagina.  You have a rash.  You have problems with any medicine. GET HELP RIGHT AWAY IF:   You start to bleed more than a regular period.  You have a fever.  You have chest pain.  You have trouble breathing.  You feel dizzy or feel like passing out (fainting).  You pass out.  You have pain in the tops of your shoulders.  You have vaginal bleeding with or without clumps of blood (blood clots). MAKE SURE YOU:  Understand these instructions.  Will watch your condition.  Will get help right away if you are not doing well or get worse. Document Released: 05/30/2008 Document Revised: 08/26/2013 Document Reviewed: 03/20/2013 [facility] Patient Information 2015 [place], [place]. This information is not intended to replace advice given to you by your health care provider. Make sure you discuss any questions you have with your health care provider.    Post Anesthesia Home Care Instructions  Activity: Get plenty of rest for the remainder of the day. A responsible individual must stay with you for 24 hours following the procedure.  For the next 24 hours, DO NOT: -Drive a car -Advertising copywriter -Drink alcoholic beverages -Take any medication unless instructed by your physician -Make any legal decisions or sign important papers.  Meals: Start with liquid foods such as gelatin or soup. Progress to regular foods as tolerated. Avoid greasy, spicy, heavy foods. If nausea and/or vomiting occur, drink only clear liquids until the nausea and/or vomiting subsides. Call your physician if vomiting continues.  Special Instructions/Symptoms: Your throat may feel dry or sore from the anesthesia or the breathing tube placed in your throat during surgery. If this causes discomfort, gargle with warm salt water. The discomfort should disappear within 24 hours.  If you had a scopolamine  patch placed behind your ear for the management of post- operative nausea and/or vomiting:  1. The medication in the patch is effective for 72 hours, after which it should be removed.  Wrap patch in a tissue and discard in the trash. Wash hands thoroughly with soap and water. 2. You may remove the patch earlier than 72 hours if you experience unpleasant side effects which may include dry mouth, dizziness or visual disturbances. 3. Avoid touching the patch. Wash your hands with soap and water after contact with the patch.

## 2020-10-13 NOTE — Anesthesia Preprocedure Evaluation (Signed)
Anesthesia Evaluation  Patient identified by MRN, date of birth, ID band Patient awake    Reviewed: Allergy & Precautions, NPO status , Patient's Chart, lab work & pertinent test results  Airway Mallampati: II  TM Distance: >3 FB Neck ROM: Full    Dental no notable dental hx. (+) Teeth Intact   Pulmonary asthma ,    Pulmonary exam normal breath sounds clear to auscultation       Cardiovascular negative cardio ROS Normal cardiovascular exam Rhythm:Regular Rate:Normal     Neuro/Psych negative neurological ROS  negative psych ROS   GI/Hepatic negative GI ROS, Neg liver ROS,   Endo/Other  Morbid obesity  Renal/GU Renal disease  negative genitourinary   Musculoskeletal negative musculoskeletal ROS (+)   Abdominal (+) + obese,   Peds  Hematology negative hematology ROS (+)   Anesthesia Other Findings   Reproductive/Obstetrics AUB Undesired fertility                             Anesthesia Physical Anesthesia Plan  ASA: II  Anesthesia Plan: General   Post-op Pain Management:    Induction: Intravenous  PONV Risk Score and Plan: Treatment may vary due to age or medical condition, Ondansetron, Scopolamine patch - Pre-op and Midazolam  Airway Management Planned: Oral ETT  Additional Equipment:   Intra-op Plan:   Post-operative Plan: Extubation in OR  Informed Consent: I have reviewed the patients History and Physical, chart, labs and discussed the procedure including the risks, benefits and alternatives for the proposed anesthesia with the patient or authorized representative who has indicated his/her understanding and acceptance.     Dental advisory given  Plan Discussed with: CRNA and Anesthesiologist  Anesthesia Plan Comments:         Anesthesia Quick Evaluation

## 2020-10-13 NOTE — Brief Op Note (Signed)
10/13/2020  11:14 AM  PATIENT:  Virginia Harris  39 y.o. female  PRE-OPERATIVE DIAGNOSIS:  Abnormal uterine bleeding Undesired Fertility  POST-OPERATIVE DIAGNOSIS:  Abnormal uterine bleeding Undesired Fertility  PROCEDURE:  Procedure(s): HYSTEROSCOPY WITH NOVASURE (N/A) LAPAROSCOPIC TUBAL LIGATION (Bilateral)  SURGEON:  Surgeon(s) and Role:    * [facility] Bing, MD - Primary  PHYSICIAN ASSISTANT:   ASSISTANTS: none   ANESTHESIA:   local and general  EBL:  70mL   BLOOD ADMINISTERED:none  DRAINS: none   LOCAL MEDICATIONS USED:  MARCAINE    and LIDOCAINE   SPECIMEN:  No Specimen  DISPOSITION OF SPECIMEN:  N/A  COUNTS:  YES  TOURNIQUET:  * No tourniquets in log *  DICTATION: .Note written in EPIC  PLAN OF CARE: Discharge to home after PACU  PATIENT DISPOSITION:  PACU - hemodynamically stable.   Delay start of Pharmacological VTE agent (>24hrs) due to surgical blood loss or risk of bleeding: not applicable  Cornelia Copa MD Attending [facility] [employer] (Faculty Practice) 10/13/2020 Time: 1116am

## 2020-10-14 ENCOUNTER — Encounter (HOSPITAL_BASED_OUTPATIENT_CLINIC_OR_DEPARTMENT_OTHER): Payer: Self-pay | Admitting: Obstetrics and Gynecology

## 2020-10-14 NOTE — Addendum Note (Signed)
Addendum  created 10/14/20 1022 by Lauralyn Primes, CRNA   Charge Capture section accepted

## 2020-10-15 ENCOUNTER — Telehealth: Payer: Self-pay

## 2020-10-15 NOTE — Telephone Encounter (Signed)
Pt left a message wanting a return phone call. Spoke with pt and pt stated she has no bleeding since surgery. Informed pt that was good. Pt states she has some pain around belly button, advised that is normal and just continue to monitor it. Pt verbalizes understanding.

## 2020-10-21 IMAGING — CR DG CHEST 2V
3 series · 3 of 3 positions shown · non-contrast
Comparison: November 21, 2017.

CLINICAL DATA: Chest pain, shortness of breath.

EXAM:
CHEST - 2 VIEW

[chest pa]
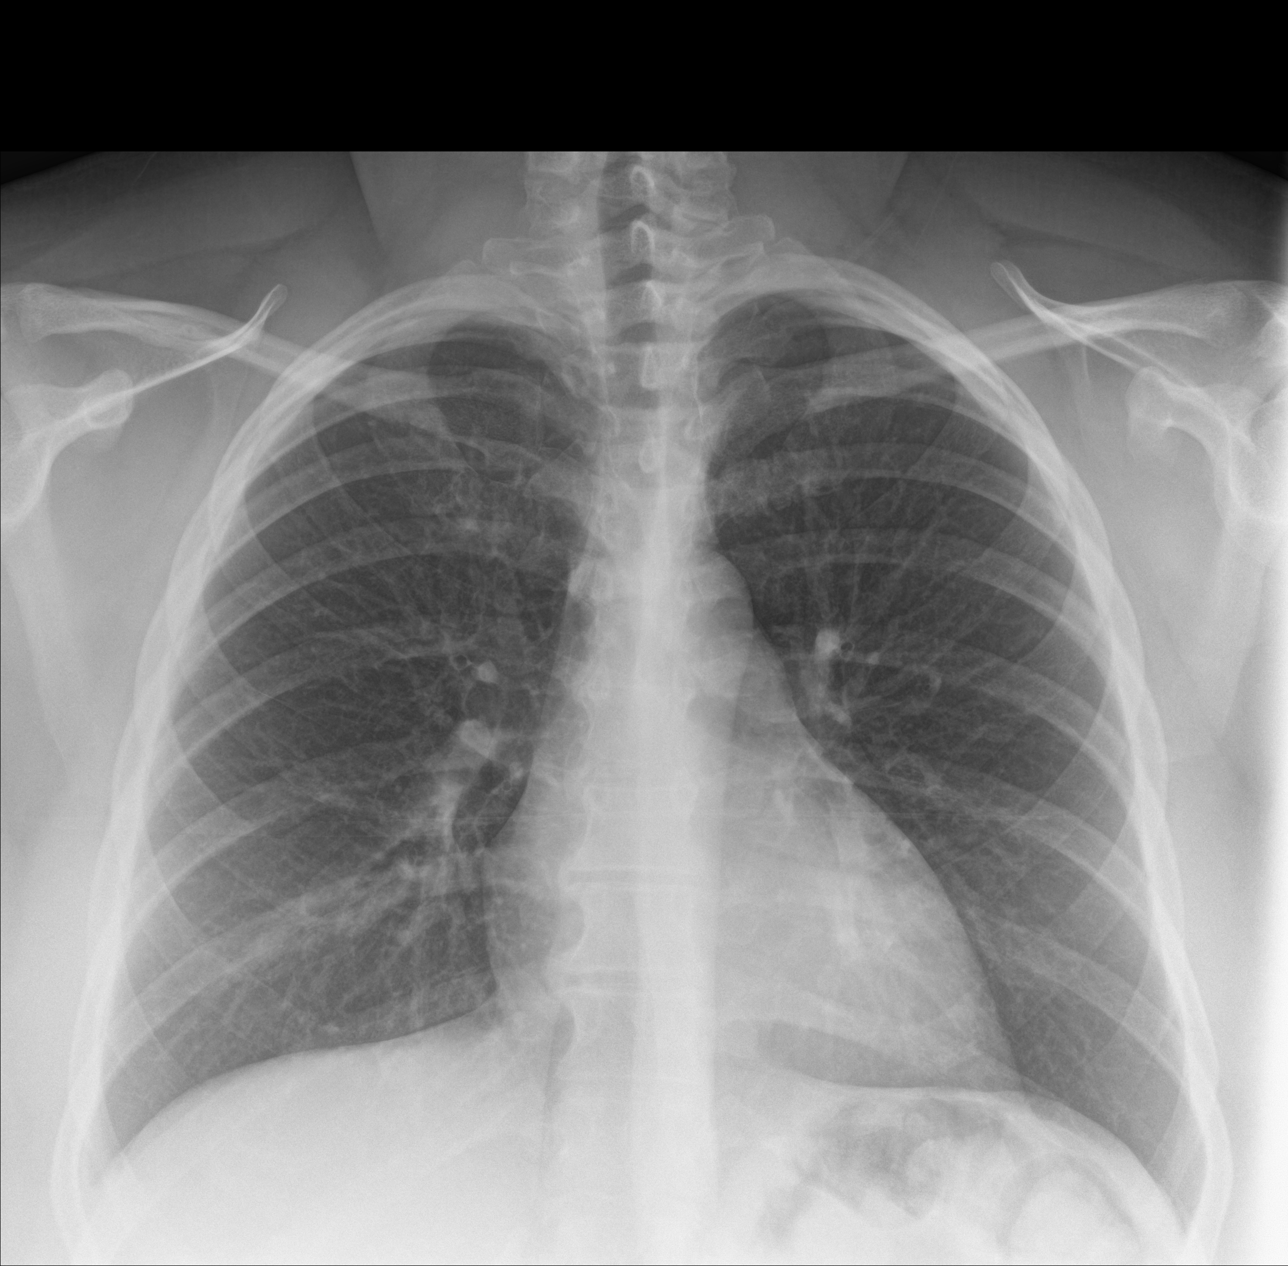

[chest lat (1 of 2)]
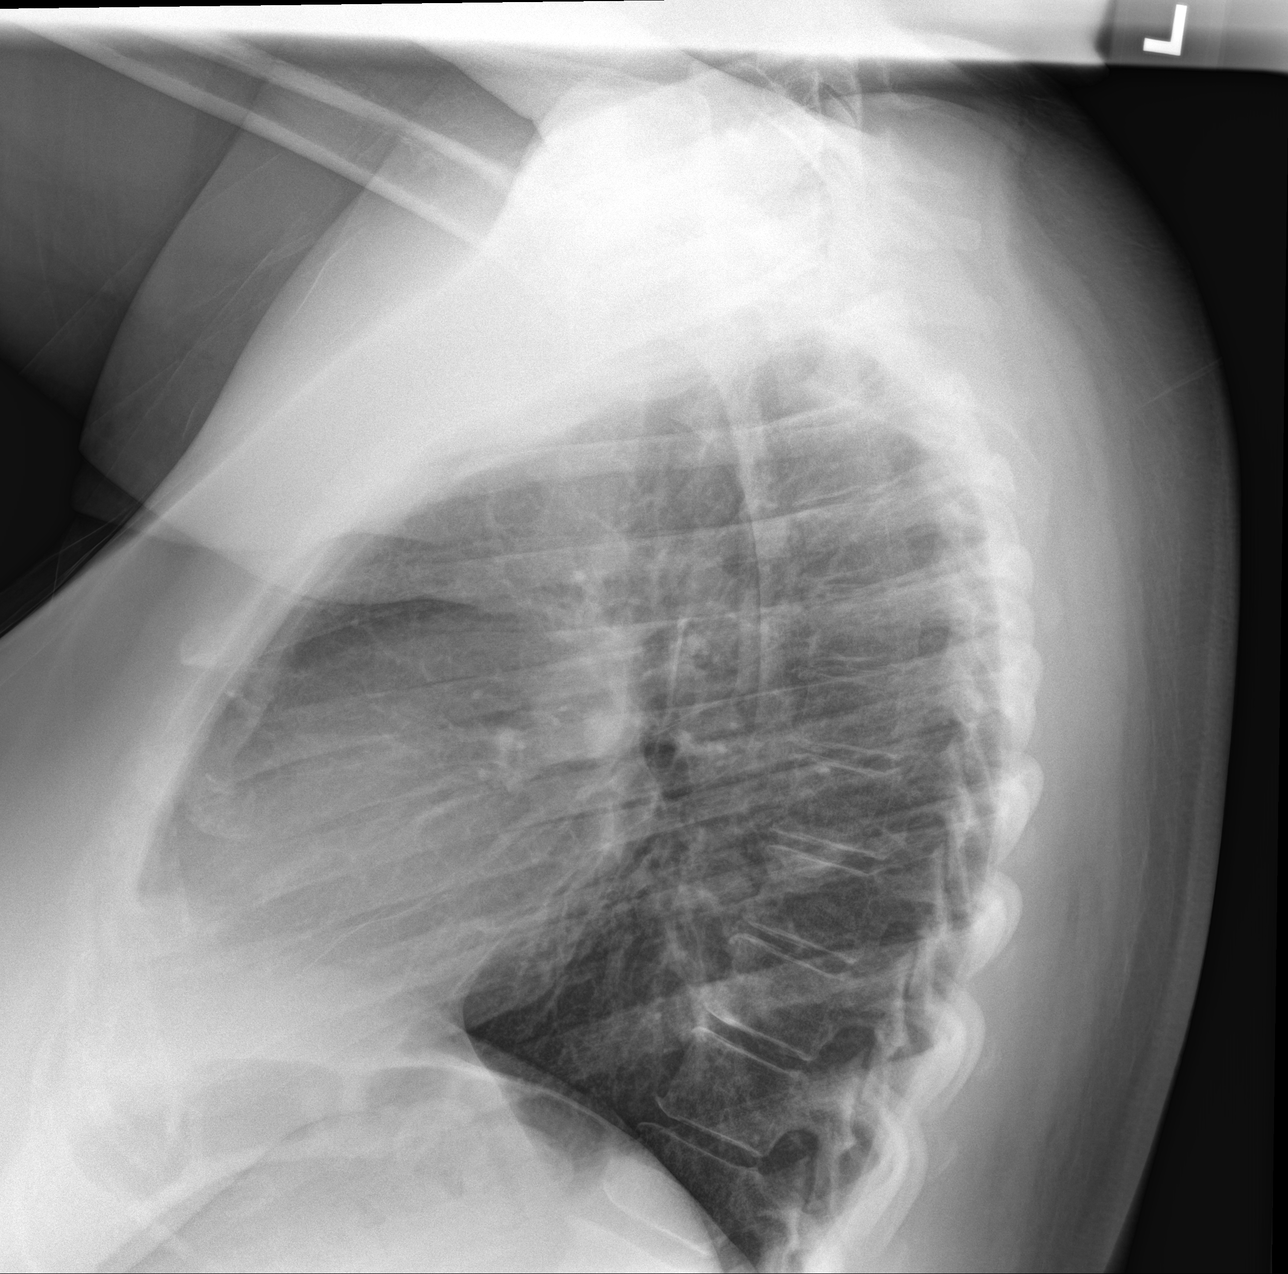

[chest lat (2 of 2)]
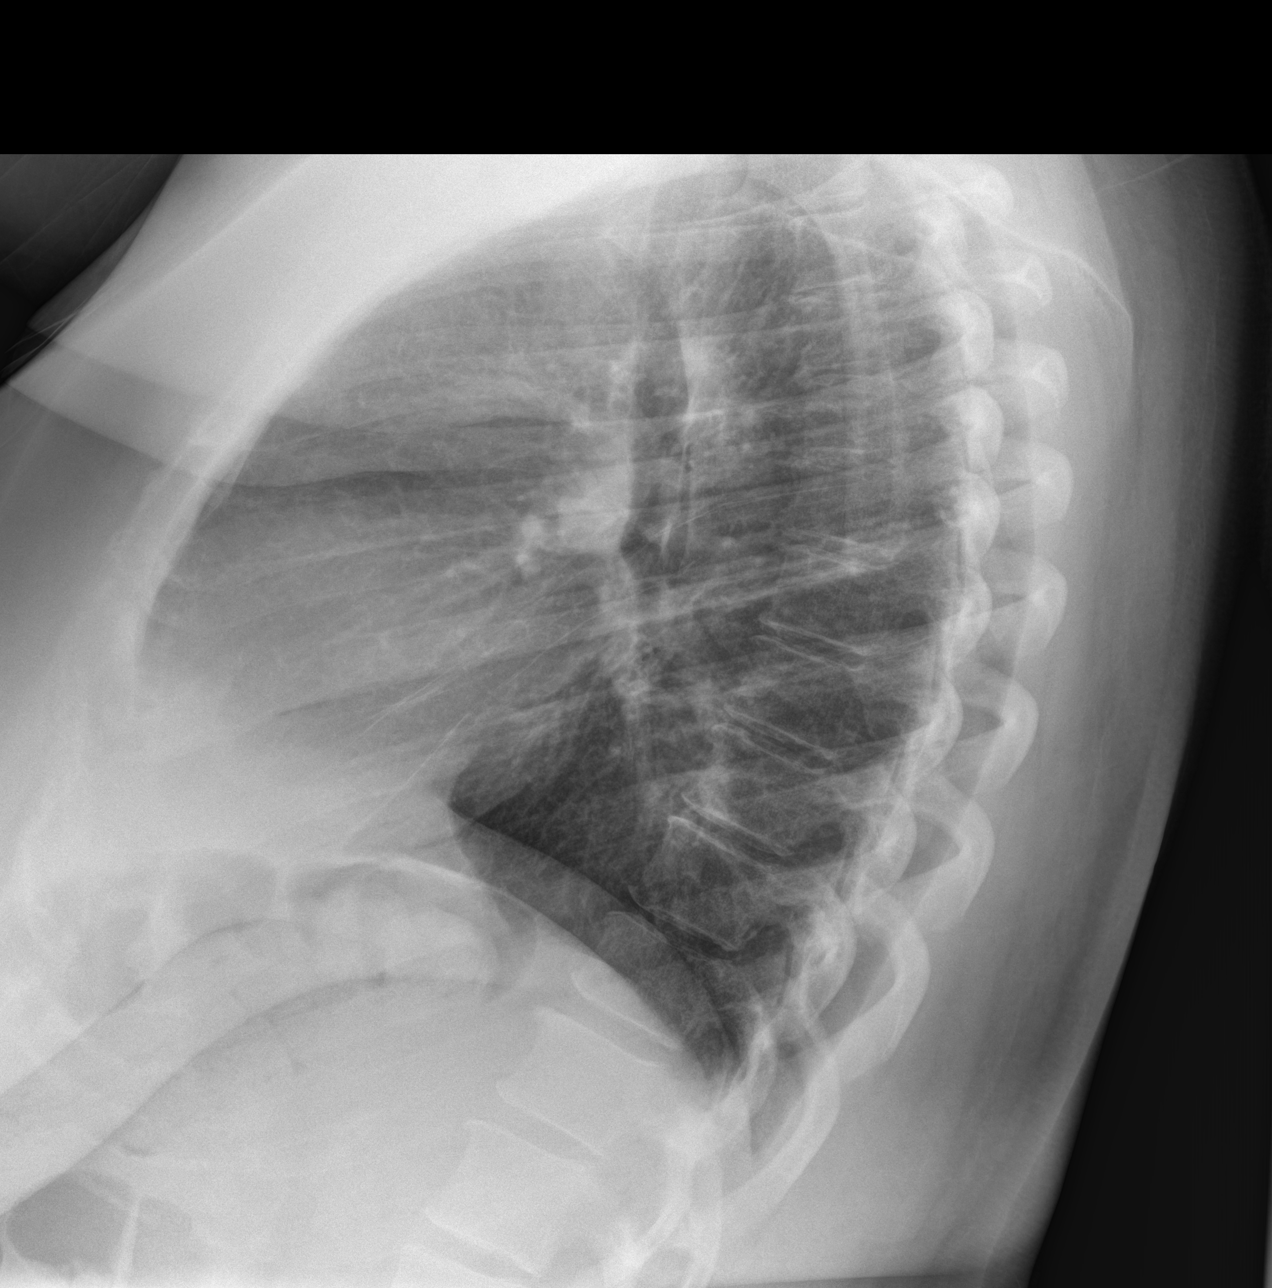

[3 of 3 positions shown; findings below may reference images not displayed]

FINDINGS: The heart size and mediastinal contours are within normal limits.
Both lungs are clear. No pneumothorax or pleural effusion is noted.
The visualized skeletal structures are unremarkable.
IMPRESSION: No active cardiopulmonary disease.

## 2020-11-01 ENCOUNTER — Other Ambulatory Visit: Payer: Self-pay

## 2020-11-01 ENCOUNTER — Encounter: Payer: Self-pay | Admitting: Obstetrics and Gynecology

## 2020-11-01 ENCOUNTER — Ambulatory Visit (INDEPENDENT_AMBULATORY_CARE_PROVIDER_SITE_OTHER): Payer: 59 | Admitting: Obstetrics and Gynecology

## 2020-11-01 VITALS — BP 136/84 | HR 80

## 2020-11-01 DIAGNOSIS — Z09 Encounter for follow-up examination after completed treatment for conditions other than malignant neoplasm: Secondary | ICD-10-CM

## 2020-11-02 NOTE — Progress Notes (Signed)
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 She had some yellowish discharge for awhile and then pink. Not sure if maybe she had a period with the pink d/c Objective:    BP 136/84   Pulse 80   NAD Abdomen: soft, nttp, nd. C/d/i umbilical incision with some dermabond still present  Assessment:    Doing well postoperatively.    Plan:   Patient doing well  Continue lifting restriction until one month post op. I told her i'm optimistic as to results of her novasure but to give it a full 90 days to see what the ablation will do. Okay to come off pelvic rest  RTC: PRN  Cornelia Copa MD Attending Center for [facility])

## 2021-06-13 ENCOUNTER — Ambulatory Visit
Admission: EM | Admit: 2021-06-13 | Discharge: 2021-06-13 | Disposition: A | Discharge status: Home / Self Care | Payer: 59 | Attending: Internal Medicine | Admitting: Internal Medicine

## 2021-06-13 DIAGNOSIS — G43909 Migraine, unspecified, not intractable, without status migrainosus: Secondary | ICD-10-CM | POA: Diagnosis not present

## 2021-06-13 DIAGNOSIS — M545 Low back pain, unspecified: Secondary | ICD-10-CM | POA: Diagnosis not present

## 2021-06-13 LAB — POCT URINALYSIS DIP (DEVICE)
Bilirubin Urine: NEGATIVE
Glucose, UA: NEGATIVE mg/dL
Hgb urine dipstick: NEGATIVE
Ketones, ur: NEGATIVE mg/dL
Leukocytes,Ua: NEGATIVE
Nitrite: NEGATIVE
Protein, ur: NEGATIVE mg/dL
Specific Gravity, Urine: 1.02 (ref 1.005–1.030)
Urobilinogen, UA: 0.2 mg/dL (ref 0.0–1.0)
pH: 7 (ref 5.0–8.0)

## 2021-06-13 MED ORDER — KETOROLAC TROMETHAMINE 60 MG/2ML IM SOLN
60.0000 mg | Freq: Once | INTRAMUSCULAR | Status: AC
  Administered 2021-06-13: 60 mg via INTRAMUSCULAR

## 2021-06-13 MED ORDER — METHOCARBAMOL 500 MG PO TABS: ORAL_TABLET | ORAL | 0 refills | Status: DC

## 2021-06-13 MED ORDER — TRAMADOL HCL 50 MG PO TABS: 50.0000 mg | ORAL_TABLET | Freq: Four times a day (QID) | ORAL | 0 refills | Status: DC | PRN

## 2021-06-13 NOTE — ED Provider Notes (Addendum)
[facility]    CSN: [id number] Arrival date & time: 06/13/21  1826      History   Chief Complaint Chief Complaint  Patient presents with   Back Pain    HPI Virginia Harris is a 40 y.o. female who presents with 2 complaints 1- R low back pain gradually x 1 week. Denies injuring herself. Pain provoked deep breaths and bending over and twisting. Denies radiation. Denies UTI symptoms or fever. Has not had back pain like this before. At the end of the visit she recalled hitting her R side/back on a curved area of her stairs, but did not think much about it. She also has been doing different type work outs including using weights. She has also tried heat on her back with no help.    2- HA x 1 week. Pain is located from occipital region and radiates to her L temple. Has photophobia and sonophobia. Denies nausea or vomiting. Had aura before this started. Has been taking Tylenol and muscle relaxers. The Tylenol only eases the HA, but does not resolved it. Has not had a migraine is a few years and no longer has medication for this. Pain level right now is 6-7/10    Past Medical History:  Diagnosis Date   Asthma    controlled   DVT (deep venous thrombosis) (HCC) 2021    Patient Active Problem List   Diagnosis Date Noted   Menorrhagia with regular cycle 07/07/2020   AKI (acute kidney injury) (HCC) 06/15/2020   History of DVT (deep vein thrombosis) 06/08/2020   BMI 40.0-44.9, adult (HCC) 09/05/2016    Past Surgical History:  Procedure Laterality Date   CORNEAL TRANSPLANT Left 2010,2013   ENDOMETRIAL BIOPSY  09/30/2020       HYSTEROSCOPY WITH NOVASURE N/A 10/13/2020   Procedure: HYSTEROSCOPY WITH NOVASURE;  Surgeon: Stephen Bing, MD;  Location: [facility];  Service: Gynecology;  Laterality: N/A;   LAPAROSCOPIC TUBAL LIGATION Bilateral 10/13/2020   Procedure: LAPAROSCOPIC TUBAL LIGATION;  Surgeon: [place] Bing, MD;  Location: [facility];   Service: Gynecology;  Laterality: Bilateral;    OB History     Gravida  5   Para  4   Term  4   Preterm      AB  1   Living  4      SAB      IAB  1   Ectopic      Multiple      Live Births  4            Home Medications    Prior to Admission medications   Medication Sig Start Date End Date Taking? Authorizing Provider  traMADol (ULTRAM) 50 MG tablet Take 1 tablet (50 mg total) by mouth every 6 (six) hours as needed. 06/13/21  Yes Rodriguez-Southworth, Nettie Elm, PA-C  methocarbamol (ROBAXIN) 500 MG tablet 1-2 q 8h prn muscle spasm 06/13/21   Rodriguez-Southworth, Nettie Elm, PA-C  rivaroxaban (XARELTO) 20 MG TABS tablet Take 1 tablet (20 mg total) by mouth daily with supper. Patient not taking: Reported on 09/30/2020 05/19/20 10/06/20  Lorre Munroe, NP    Family History Family History  Problem Relation Age of Onset   Breast cancer Maternal Grandmother     Social History Social History   Tobacco Use   Smoking status: Never   Smokeless tobacco: Never  Vaping Use   Vaping Use: Never used  Substance Use Topics   Alcohol use: Yes  Comment: occasional   Drug use: No     Allergies   Fruit extracts   Review of Systems Review of Systems  Constitutional:  Negative for chills and fever.  HENT:  Negative for congestion, rhinorrhea and sinus pressure.   Eyes:  Positive for photophobia.  Respiratory:  Negative for cough.   Gastrointestinal:  Negative for abdominal pain, nausea and vomiting.  Genitourinary:  Negative for dyspareunia, flank pain, frequency, hematuria and urgency.  Musculoskeletal:  Positive for back pain and neck pain.       L neck   Skin:  Negative for rash and wound.  Neurological:  Positive for headaches. Negative for weakness and numbness.    Physical Exam Triage Vital Signs ED Triage Vitals  Enc Vitals Group     BP 06/13/21 1857 126/86     Pulse Rate 06/13/21 1857 76     Resp 06/13/21 1857 18     Temp 06/13/21 1857 98.6 F (37  C)     Temp Source 06/13/21 1857 Oral     SpO2 06/13/21 1857 100 %     Weight 06/13/21 1855 230 lb (104.3 kg)     Height 06/13/21 1855 5\' 5"  (1.651 m)     Head Circumference --      Peak Flow --      Pain Score 06/13/21 1855 9     Pain Loc --      Pain Edu? --      Excl. in GC? --    No data found.  Updated Vital Signs BP 126/86 (BP Location: Left Arm)   Pulse 76   Temp 98.6 F (37 C) (Oral)   Resp 18   Ht 5\' 5"  (1.651 m)   Wt 230 lb (104.3 kg)   SpO2 100%   BMI 38.27 kg/m   Visual Acuity Right Eye Distance:   Left Eye Distance:   Bilateral Distance:    Right Eye Near:   Left Eye Near:    Bilateral Near:     Physical Exam Physical Exam Vitals signs and nursing note reviewed.  Constitutional:      General: He is not in acute distress.    Appearance: He is well-developed and normal weight. He is not ill-appearing, toxic-appearing or diaphoretic. She is sitting leaning over to her L due to her back hurting. She moves in pain.  HENT:     Head: Normocephalic.  Eyes:     Extraocular Movements: Extraocular movements intact.     Pupils: Pupils are equal, round, and reactive to light.  Neck:     Musculoskeletal: Neck supple. No neck rigidity.     Meningeal: Brudzinski's sign absent. Has local tenderness on L occipital region at the insertion of the trapezius and mild on the L temple Cardiovascular:     Rate and Rhythm: Normal rate and regular rhythm.     Heart sounds: No murmur.  Pulmonary:     Effort: Pulmonary effort is normal.     Breath sounds: Normal breath sounds. No wheezing, rhonchi or rales.  Abdominal:     General: Bowel sounds are normal.     Palpations: Abdomen is soft. There is no mass.     Tenderness: There is no abdominal tenderness. There is no guarding.  Musculoskeletal: Normal range of motion.  BACK- has local soft tissue tenderness on R upper lumbar region, even with just pinching her rolls. She was in tears with palpation and ROM, so I did not  do CVA test.  Lymphadenopathy:     Cervical: No cervical adenopathy.  Skin:    General: Skin is warm and dry.  Neurological:     Mental Status: He is alert.     Cranial Nerves: No cranial nerve deficit or facial asymmetry.  Gait: Gait normal.     Deep Tendon Reflexes: Reflexes normal.     Comments: Normal Romberg, propioception, finger to nose, tandem gait.   Psychiatric:        Mood and Affect: Mood normal.        Speech: Speech normal.        Behavior: Behavior normal.     UC Treatments / Results  Labs (all labs ordered are listed, but only abnormal results are displayed) Labs Reviewed  POCT URINALYSIS DIP (DEVICE)  POCT URINALYSIS DIPSTICK, ED / UC    EKG   Radiology No results found.  Procedures Procedures (including critical care time)  Medications Ordered in UC Medications  ketorolac (TORADOL) injection 60 mg (60 mg Intramuscular Given 06/13/21 2004)    Initial Impression / Assessment and Plan / UC Course  I have reviewed the triage vital signs and the nursing notes. Pertinent labs results that were available during my care of the patient were reviewed by me and considered in my medical decision making (see chart for details). Acute Migraine She was given Toradol 60 mg IM and her HA pain decreased to 2/10 at discharge, but her back was minimally better I placed her on Robaxen and Tramadol for pain Needs to avoid exercising this week. See instructions.       Final Clinical Impressions(s) / UC Diagnoses   Final diagnoses:  Acute migraine  Lumbar back pain     Discharge Instructions      Follow up with your family Dr this week      ED Prescriptions     Medication Sig Dispense Auth. Provider   methocarbamol (ROBAXIN) 500 MG tablet  (Status: Discontinued) 1-2 q 8h prn muscle spasm 20 tablet Rodriguez-Southworth, Meaghan Whistler, PA-C   traMADol (ULTRAM) 50 MG tablet Take 1 tablet (50 mg total) by mouth every 6 (six) hours as needed. 15 tablet  Rodriguez-Southworth, Gloristine Turrubiates, PA-C   methocarbamol (ROBAXIN) 500 MG tablet 1-2 q 8h prn muscle spasm 30 tablet Rodriguez-Southworth, Nettie Elm, PA-C      I have reviewed the PDMP during this encounter.   Garey Ham, Cordelia Poche 06/13/21 2153    Rodriguez-SouthworthNettie Elm, PA-C 06/13/21 2154

## 2021-06-13 NOTE — ED Triage Notes (Signed)
Pt here with C/O lower right side back pain for 1 week, and a migraine for 1 week. No known injury.

## 2021-06-13 NOTE — Discharge Instructions (Addendum)
Follow up with your family Dr this week 

## 2021-06-16 ENCOUNTER — Emergency Department: Payer: 59

## 2021-06-16 ENCOUNTER — Other Ambulatory Visit: Payer: Self-pay

## 2021-06-16 ENCOUNTER — Encounter: Payer: Self-pay | Admitting: Emergency Medicine

## 2021-06-16 ENCOUNTER — Emergency Department
Admission: EM | Admit: 2021-06-16 | Discharge: 2021-06-16 | Disposition: A | Discharge status: Home / Self Care | Payer: 59 | Attending: Emergency Medicine | Admitting: Emergency Medicine

## 2021-06-16 DIAGNOSIS — J45909 Unspecified asthma, uncomplicated: Secondary | ICD-10-CM | POA: Diagnosis not present

## 2021-06-16 DIAGNOSIS — R7989 Other specified abnormal findings of blood chemistry: Secondary | ICD-10-CM | POA: Insufficient documentation

## 2021-06-16 DIAGNOSIS — Z7901 Long term (current) use of anticoagulants: Secondary | ICD-10-CM | POA: Diagnosis not present

## 2021-06-16 DIAGNOSIS — M545 Low back pain, unspecified: Secondary | ICD-10-CM | POA: Diagnosis not present

## 2021-06-16 DIAGNOSIS — R0602 Shortness of breath: Secondary | ICD-10-CM | POA: Insufficient documentation

## 2021-06-16 DIAGNOSIS — M546 Pain in thoracic spine: Secondary | ICD-10-CM | POA: Insufficient documentation

## 2021-06-16 DIAGNOSIS — K449 Diaphragmatic hernia without obstruction or gangrene: Secondary | ICD-10-CM | POA: Diagnosis not present

## 2021-06-16 DIAGNOSIS — M549 Dorsalgia, unspecified: Secondary | ICD-10-CM | POA: Diagnosis not present

## 2021-06-16 DIAGNOSIS — R079 Chest pain, unspecified: Secondary | ICD-10-CM | POA: Diagnosis not present

## 2021-06-16 LAB — CBC WITH DIFFERENTIAL/PLATELET
Abs Immature Granulocytes: 0.01 10*3/uL (ref 0.00–0.07)
Basophils Absolute: 0.1 10*3/uL (ref 0.0–0.1)
Basophils Relative: 1 %
Eosinophils Absolute: 0.3 10*3/uL (ref 0.0–0.5)
Eosinophils Relative: 4 %
HCT: 40.6 % (ref 36.0–46.0)
Hemoglobin: 13.7 g/dL (ref 12.0–15.0)
Immature Granulocytes: 0 %
Lymphocytes Relative: 44 %
Lymphs Abs: 3.3 10*3/uL (ref 0.7–4.0)
MCH: 30.9 pg (ref 26.0–34.0)
MCHC: 33.7 g/dL (ref 30.0–36.0)
MCV: 91.4 fL (ref 80.0–100.0)
Monocytes Absolute: 0.5 10*3/uL (ref 0.1–1.0)
Monocytes Relative: 7 %
Neutro Abs: 3.4 10*3/uL (ref 1.7–7.7)
Neutrophils Relative %: 44 %
Platelets: 346 10*3/uL (ref 150–400)
RBC: 4.44 MIL/uL (ref 3.87–5.11)
RDW: 14.2 % (ref 11.5–15.5)
WBC: 7.6 10*3/uL (ref 4.0–10.5)
nRBC: 0 % (ref 0.0–0.2)

## 2021-06-16 LAB — BASIC METABOLIC PANEL
Anion gap: 8 (ref 5–15)
BUN: 12 mg/dL (ref 6–20)
CO2: 26 mmol/L (ref 22–32)
Calcium: 10.2 mg/dL (ref 8.9–10.3)
Chloride: 102 mmol/L (ref 98–111)
Creatinine, Ser: 1.25 mg/dL — ABNORMAL HIGH (ref 0.44–1.00)
GFR, Estimated: 56 mL/min — ABNORMAL LOW (ref >60)
Glucose, Bld: 98 mg/dL (ref 70–99)
Potassium: 4.3 mmol/L (ref 3.5–5.1)
Sodium: 136 mmol/L (ref 135–145)

## 2021-06-16 LAB — D-DIMER, QUANTITATIVE: D-Dimer, Quant: 0.53 ug/mL-FEU — ABNORMAL HIGH (ref 0.00–0.50)

## 2021-06-16 MED ORDER — HYDROCODONE-ACETAMINOPHEN 5-325 MG PO TABS
1.0000 | ORAL_TABLET | Freq: Four times a day (QID) | ORAL | 0 refills | Status: AC | PRN
  Filled 2021-06-16: qty 12, 3d supply, fill #0

## 2021-06-16 MED ORDER — NAPROXEN 500 MG PO TABS
500.0000 mg | ORAL_TABLET | Freq: Two times a day (BID) | ORAL | 0 refills | Status: DC
  Filled 2021-06-16: qty 30, 15d supply, fill #0

## 2021-06-16 MED ORDER — KETOROLAC TROMETHAMINE 30 MG/ML IJ SOLN
15.0000 mg | Freq: Once | INTRAMUSCULAR | Status: AC
  Administered 2021-06-16: 15 mg via INTRAVENOUS
  Filled 2021-06-16: qty 1

## 2021-06-16 MED ORDER — TIZANIDINE HCL 4 MG PO TABS
4.0000 mg | ORAL_TABLET | Freq: Three times a day (TID) | ORAL | 0 refills | Status: DC
  Filled 2021-06-16: qty 30, 10d supply, fill #0

## 2021-06-16 MED ORDER — IOHEXOL 350 MG/ML SOLN
75.0000 mL | Freq: Once | INTRAVENOUS | Status: AC | PRN
  Administered 2021-06-16: 75 mL via INTRAVENOUS
  Filled 2021-06-16: qty 75

## 2021-06-16 MED ORDER — SODIUM CHLORIDE 0.9 % IV BOLUS
1000.0000 mL | Freq: Once | INTRAVENOUS | Status: AC
  Administered 2021-06-16: 1000 mL via INTRAVENOUS

## 2021-06-16 NOTE — Discharge Instructions (Addendum)
Follow up with primary care if not improving over the weekend.  Return to the ER for symptoms that change or worsen if unable to schedule an appointment.

## 2021-06-16 NOTE — ED Notes (Signed)
See triage note  presents with right lower back pain  states pain started 1 week ago w/o injury  states pain is non radiating  was seen and dx'd with muscle strain  states pain is not any better with meds   ambulates well to treatment room

## 2021-06-16 NOTE — ED Provider Notes (Signed)
[facility] Emergency Department Provider Note ____________________________________________   Event Date/Time   First MD Initiated Contact with Patient 06/16/21 0732     (approximate)  I have reviewed the triage vital signs and the nursing notes.   HISTORY  Chief Complaint Back Pain  HPI Virginia Harris is a 40 y.o. female with history of asthma and DVT not currently on anticoagulation presents to the emergency department for treatment and evaluation of non-traumatic right side back pain that increases with movement and deep breath. She denies recent illness but has felt short of breath occasionally since pain started. No repetitive movement.  Her job requires sitting for long periods.  She has not traveled recently.  She is not on birth control.  She did have a DVT in her lower extremity last year and was taken off anticoagulant after 3 months..     Past Medical History:  Diagnosis Date   Asthma    controlled   DVT (deep venous thrombosis) (HCC) 2021    Patient Active Problem List   Diagnosis Date Noted   Menorrhagia with regular cycle 07/07/2020   AKI (acute kidney injury) (HCC) 06/15/2020   History of DVT (deep vein thrombosis) 06/08/2020   BMI 40.0-44.9, adult (HCC) 09/05/2016    Past Surgical History:  Procedure Laterality Date   CORNEAL TRANSPLANT Left 2010,2013   ENDOMETRIAL BIOPSY  09/30/2020       HYSTEROSCOPY WITH NOVASURE N/A 10/13/2020   Procedure: HYSTEROSCOPY WITH NOVASURE;  Surgeon: [place] Bing, MD;  Location: [facility];  Service: Gynecology;  Laterality: N/A;   LAPAROSCOPIC TUBAL LIGATION Bilateral 10/13/2020   Procedure: LAPAROSCOPIC TUBAL LIGATION;  Surgeon: [place] Bing, MD;  Location: [facility];  Service: Gynecology;  Laterality: Bilateral;    Prior to Admission medications   Medication Sig Start Date End Date Taking? Authorizing Provider  HYDROcodone-acetaminophen (NORCO/VICODIN) 5-325 MG  tablet Take 1 tablet by mouth every 6 (six) hours as needed for up to 3 days for severe pain. 06/16/21 06/19/21 Yes Nydia Ytuarte B, FNP  naproxen (NAPROSYN) 500 MG tablet Take 1 tablet (500 mg total) by mouth 2 (two) times daily with a meal. 06/16/21  Yes Marilyn Nihiser B, FNP  tiZANidine (ZANAFLEX) 4 MG tablet Take 1 tablet (4 mg total) by mouth 3 (three) times daily. 06/16/21  Yes Elinora Weigand B, FNP  rivaroxaban (XARELTO) 20 MG TABS tablet Take 1 tablet (20 mg total) by mouth daily with supper. Patient not taking: Reported on 09/30/2020 05/19/20 10/06/20  Lorre Munroe, NP    Allergies Fruit extracts  Family History  Problem Relation Age of Onset   Breast cancer Maternal Grandmother     Social History Social History   Tobacco Use   Smoking status: Never   Smokeless tobacco: Never  Vaping Use   Vaping Use: Never used  Substance Use Topics   Alcohol use: Yes    Comment: occasional   Drug use: No    Review of Systems  Constitutional: No fever/chills Eyes: No visual changes. ENT: No sore throat. Cardiovascular: Denies chest pain. Respiratory: Intermittent shortness of breath. Gastrointestinal: No abdominal pain.  No nausea, no vomiting.  No diarrhea.  No constipation. Genitourinary: Negative for dysuria. Musculoskeletal: Positive for back pain. Skin: Negative for rash. Neurological: Negative for headaches, focal weakness or numbness. ____________________________________________   PHYSICAL EXAM:  VITAL SIGNS: ED Triage Vitals  Enc Vitals Group     BP 06/16/21 0724 136/90     Pulse Rate  06/16/21 0724 72     Resp 06/16/21 0724 18     Temp 06/16/21 0724 98.7 F (37.1 C)     Temp Source 06/16/21 0724 Oral     SpO2 06/16/21 0724 97 %     Weight 06/16/21 0719 229 lb 4.5 oz (104 kg)     Height 06/16/21 0719 5\' 5"  (1.651 m)     Head Circumference --      Peak Flow --      Pain Score 06/16/21 0718 8     Pain Loc --      Pain Edu? --      Excl. in GC? --      Constitutional: Alert and oriented. Well appearing and in no acute distress. Eyes: Conjunctivae are normal.  Head: Atraumatic. Nose: No congestion/rhinnorhea. Mouth/Throat: Mucous membranes are moist.  Oropharynx non-erythematous. Neck: No stridor.   Hematological/Lymphatic/Immunilogical: No cervical lymphadenopathy. Cardiovascular: Normal rate, regular rhythm. Grossly normal heart sounds.  Good peripheral circulation. Respiratory: Normal respiratory effort.  No retractions. Lungs CTAB. Gastrointestinal: Soft and nontender. No distention. No abdominal bruits. No CVA tenderness. Genitourinary:  Musculoskeletal: No lower extremity tenderness nor edema.  No joint effusions. Neurologic:  Normal speech and language. No gross focal neurologic deficits are appreciated. No gait instability. Skin:  Skin is warm, dry and intact. No rash noted. Psychiatric: Mood and affect are normal. Speech and behavior are normal.  ____________________________________________   LABS (all labs ordered are listed, but only abnormal results are displayed)  Labs Reviewed  BASIC METABOLIC PANEL - Abnormal; Notable for the following components:      Result Value   Creatinine, Ser 1.25 (*)    GFR, Estimated 56 (*)    All other components within normal limits  D-DIMER, QUANTITATIVE - Abnormal; Notable for the following components:   D-Dimer, Quant 0.53 (*)    All other components within normal limits  CBC WITH DIFFERENTIAL/PLATELET  POC URINE PREG, ED   ____________________________________________  EKG  Not indicated. ____________________________________________  RADIOLOGY  ED MD interpretation:    Chest x-ray without acute concerns.  CT angio chest rule out PE negative.  I, 06/18/21, personally viewed and evaluated these images (plain radiographs) as part of my medical decision making, as well as reviewing the written report by the radiologist.  Official radiology report(s): DG Chest 2  View  Result Date: 06/16/2021 CLINICAL DATA:  Right back pain EXAM: CHEST - 2 VIEW COMPARISON:  August 2021 FINDINGS: The heart size and mediastinal contours are within normal limits. Both lungs are clear. No pleural effusion. The visualized skeletal structures are unremarkable. IMPRESSION: No acute process in the chest. Electronically Signed   By: September 2021 M.D.   On: 06/16/2021 08:26   CT Angio Chest PE W and/or Wo Contrast  Result Date: 06/16/2021 CLINICAL DATA:  Lower back pain positive D-dimer EXAM: CT ANGIOGRAPHY CHEST WITH CONTRAST TECHNIQUE: Multidetector CT imaging of the chest was performed using the standard protocol during bolus administration of intravenous contrast. Multiplanar CT image reconstructions and MIPs were obtained to evaluate the vascular anatomy. CONTRAST:  47mL OMNIPAQUE IOHEXOL 350 MG/ML SOLN COMPARISON:  Same day chest radiograph FINDINGS: Cardiovascular: There is adequate opacification of the pulmonary arteries to the segmental level. There is no evidence of pulmonary embolism. The heart is not enlarged.  There is no pericardial effusion. Mediastinum/Nodes: The imaged thyroid is unremarkable. There is no mediastinal, hilar, or axillary lymphadenopathy. There is a small hiatal hernia. The esophagus is otherwise grossly unremarkable. Lungs/Pleura:  The trachea and central airways are patent. The lungs are clear, with no focal consolidation or pulmonary edema. There is no pleural effusion or pneumothorax. There are no suspicious nodules. Upper Abdomen: The imaged portions of the upper abdominal viscera are unremarkable. Musculoskeletal: There is no acute osseous abnormality or aggressive osseous lesion. There is mild degenerative change in the midthoracic spine. Review of the MIP images confirms the above findings. IMPRESSION: No evidence of pulmonary embolism or other acute pathology in the chest. Electronically Signed   By: Lesia Hausen M.D.   On: 06/16/2021 10:36     ____________________________________________   PROCEDURES  Procedure(s) performed (including Critical Care):  Procedures  ____________________________________________   INITIAL IMPRESSION / ASSESSMENT AND PLAN     40 year old female presenting to the emergency department for treatment and evaluation of right mid and lower thoracic pain that increases with deep breath and movement.  See HPI for further details.  Plan will be to get labs including D-dimer with her history of DVT  DIFFERENTIAL DIAGNOSIS  Musculoskeletal pain, PE  ED COURSE  Labs indicate D-dimer of 1.53 and a mild bump in creatinine at 1.25 but are otherwise normal.  Plan will be to get a CTA to rule out PE.  Will give a liter of fluids as well.  No PE on CTA of the chest.  Toradol ordered.  Will discharge home with Zanaflex, Norco, and Naprosyn.  She is to follow-up with her primary care provider for symptoms that are not improving over the weekend.  She will be provided a work excuse as well.  She is to return to the emergency department for symptoms that change or worsen if she is unable to schedule an appointment.    ___________________________________________   FINAL CLINICAL IMPRESSION(S) / ED DIAGNOSES  Final diagnoses:  Acute right-sided thoracic back pain     ED Discharge Orders          Ordered    HYDROcodone-acetaminophen (NORCO/VICODIN) 5-325 MG tablet  Every 6 hours PRN        06/16/21 1050    tiZANidine (ZANAFLEX) 4 MG tablet  3 times daily        06/16/21 1050    naproxen (NAPROSYN) 500 MG tablet  2 times daily with meals        06/16/21 1051             Virginia Harris was evaluated in Emergency Department on 06/16/2021 for the symptoms described in the history of present illness. She was evaluated in the context of the global COVID-19 pandemic, which necessitated consideration that the patient might be at risk for infection with the SARS-CoV-2 virus that causes COVID-19.  Institutional protocols and algorithms that pertain to the evaluation of patients at risk for COVID-19 are in a state of rapid change based on information released by regulatory bodies including the CDC and federal and state organizations. These policies and algorithms were followed during the patient's care in the ED.   Note:  This document was prepared using Dragon voice recognition software and may include unintentional dictation errors.    Chinita Pester, FNP 06/16/21 1058    Chesley Noon, MD 06/16/21 1455

## 2021-06-16 NOTE — ED Triage Notes (Addendum)
Right lower back pain x 1 week.  Denies injury  Seen through Urgent Care on Monday for same.  Given pain medication and muscle relaxers, no improvement in symptoms.

## 2021-06-20 ENCOUNTER — Ambulatory Visit
Admission: RE | Admit: 2021-06-20 | Discharge: 2021-06-20 | Disposition: A | Discharge status: Home / Self Care | Payer: 59 | Source: Ambulatory Visit | Attending: Internal Medicine | Admitting: Internal Medicine

## 2021-06-20 ENCOUNTER — Other Ambulatory Visit: Payer: Self-pay

## 2021-06-20 ENCOUNTER — Ambulatory Visit (INDEPENDENT_AMBULATORY_CARE_PROVIDER_SITE_OTHER): Payer: 59 | Admitting: Internal Medicine

## 2021-06-20 ENCOUNTER — Encounter: Payer: Self-pay | Admitting: Internal Medicine

## 2021-06-20 ENCOUNTER — Ambulatory Visit
Admission: RE | Admit: 2021-06-20 | Discharge: 2021-06-20 | Disposition: A | Discharge status: Home / Self Care | Payer: 59 | Attending: Internal Medicine | Admitting: Internal Medicine

## 2021-06-20 VITALS — BP 132/73 | Temp 97.7°F | Resp 17 | Ht 65.0 in | Wt 233.8 lb

## 2021-06-20 DIAGNOSIS — Z6838 Body mass index (BMI) 38.0-38.9, adult: Secondary | ICD-10-CM | POA: Diagnosis not present

## 2021-06-20 DIAGNOSIS — Z6841 Body Mass Index (BMI) 40.0 and over, adult: Secondary | ICD-10-CM | POA: Insufficient documentation

## 2021-06-20 DIAGNOSIS — R7303 Prediabetes: Secondary | ICD-10-CM | POA: Insufficient documentation

## 2021-06-20 DIAGNOSIS — N183 Chronic kidney disease, stage 3 unspecified: Secondary | ICD-10-CM | POA: Insufficient documentation

## 2021-06-20 DIAGNOSIS — G43119 Migraine with aura, intractable, without status migrainosus: Secondary | ICD-10-CM | POA: Diagnosis not present

## 2021-06-20 DIAGNOSIS — M545 Low back pain, unspecified: Secondary | ICD-10-CM | POA: Diagnosis not present

## 2021-06-20 DIAGNOSIS — E6609 Other obesity due to excess calories: Secondary | ICD-10-CM | POA: Insufficient documentation

## 2021-06-20 LAB — POCT URINALYSIS DIPSTICK
Bilirubin, UA: NEGATIVE
Blood, UA: NEGATIVE
Glucose, UA: NEGATIVE
Ketones, UA: NEGATIVE
Leukocytes, UA: NEGATIVE
Nitrite, UA: NEGATIVE
Protein, UA: NEGATIVE
Spec Grav, UA: 1.02 (ref 1.010–1.025)
Urobilinogen, UA: 0.2 E.U./dL
pH, UA: 5 (ref 5.0–8.0)

## 2021-06-20 NOTE — Assessment & Plan Note (Signed)
Encouraged weight loss as this can help reduce back pain 

## 2021-06-20 NOTE — Patient Instructions (Signed)

## 2021-06-20 NOTE — Progress Notes (Signed)
Subjective:    Patient ID: Virginia Harris, female    DOB: Dec 08, 1980, 40 y.o.   MRN: [id number]  HPI  Patient presents the clinic today for multiple visits urgent care/ER follow-up.  She presented to urgent care 06/13/2021 with complaint of right-sided low back pain and a migraine.  She thinks she may have hit her back on the stairs.  No imaging was obtained.  She reports history of migraines with aura, sensitivity to light and sound.  She has not had migraines in years so she had only taken Tylenol and Flexeril with minimal relief of symptoms.  She was treated with IM Toradol, given prescriptions for methocarbamol and tramadol.  She presented to the ER 06/16/2021 with complaint of right side mid back pain.  The pain was worse with movement and taking a deep breath.  Her chest x-ray was negative.  Her labs showed a mild bump in her creatinine so she was given a liter of fluids.  She had a mildly elevated D-dimer but CTA was negative for pulmonary embolism.  She was treated with Toradol and discharged with prescriptions for Vicodin, Naproxen and Tizanidine.  She was advised to follow-up with her PCP.  Since discharge, she reports she has persistent right-sided low back pain.  She describes the pain as a constant ache but can be sharp and stabbing with certain movements.  The pain is worse with sitting for prolonged period of time, movement such as twisting or bending, and deep breathing.  The pain does not radiate down her right leg.  She has GI, urinary or vaginal complaints.  She has not had any injury that she is aware of.  She has not noticed a rash.  She reports the medication she has been prescribed are only providing minimal relief.  Review of Systems  Past Medical History:  Diagnosis Date   Asthma    controlled   DVT (deep venous thrombosis) (HCC) 2021    Current Outpatient Medications  Medication Sig Dispense Refill   naproxen (NAPROSYN) 500 MG tablet Take 1 tablet (500 mg total) by  mouth 2 (two) times daily with a meal. 30 tablet 0   tiZANidine (ZANAFLEX) 4 MG tablet Take 1 tablet (4 mg total) by mouth 3 (three) times daily. 30 tablet 0   No current facility-administered medications for this visit.    Allergies  Allergen Reactions   Fruit Extracts Hives    strawberries    Family History  Problem Relation Age of Onset   Breast cancer Maternal Grandmother     Social History   Socioeconomic History   Marital status: Single    Spouse name: Not on file   Number of children: 4   Years of education: Not on file   Highest education level: Not on file  Occupational History   Not on file  Tobacco Use   Smoking status: Never   Smokeless tobacco: Never  Vaping Use   Vaping Use: Never used  Substance and Sexual Activity   Alcohol use: Yes    Comment: occasional   Drug use: No   Sexual activity: Yes    Birth control/protection: None  Other Topics Concern   Not on file  Social History Narrative   Not on file   Social Determinants of Health   Financial Resource Strain: Not on file  Food Insecurity: Not on file  Transportation Needs: Not on file  Physical Activity: Not on file  Stress: Not on file  Social Connections: Not on  file  Intimate Partner Violence: Not on file     Constitutional: Denies fever, malaise, fatigue, headache or abrupt weight changes.  Respiratory: Denies difficulty breathing, shortness of breath, cough or sputum production.   Cardiovascular: Denies chest pain, chest tightness, palpitations or swelling in the hands or feet.  Gastrointestinal: Denies abdominal pain, bloating, constipation, diarrhea or blood in the stool.  GU: Denies urgency, frequency, pain with urination, burning sensation, blood in urine, odor or discharge. Musculoskeletal: Patient reports right side low back pain.  Denies decrease in range of motion, difficulty with gait, or joint swelling.  Skin: Denies redness, rashes, lesions or ulcercations.  Neurological:  Denies numbness, tingling, weakness or problems with balance and coordination.    No other specific complaints in a complete review of systems (except as listed in HPI above).     Objective:   Physical Exam   BP 132/73 (BP Location: Right Arm, Patient Position: Sitting, Cuff Size: Large)   Temp 97.7 F (36.5 C) (Temporal)   Resp 17   Ht 5\' 5"  (1.651 m)   Wt 233 lb 12.8 oz (106.1 kg)   SpO2 99%   BMI 38.91 kg/m   Wt Readings from Last 3 Encounters:  06/16/21 229 lb 4.5 oz (104 kg)  06/13/21 230 lb (104.3 kg)  10/13/20 248 lb 0.3 oz (112.5 kg)    General: Appears her stated age, obese, in NAD. Skin: Warm, dry and intact. No rashes noted. HEENT: Head: normal shape and size; Eyes: sclera white and EOMs intact;  Cardiovascular: Normal rate and rhythm. S1,S2 noted.  No murmur, rubs or gallops noted.  Pulmonary/Chest: Normal effort and positive vesicular breath sounds. No respiratory distress. No wheezes, rales or ronchi noted.  Abdomen: + CVA tenderness on the right. Musculoskeletal: Decreased flexion, rotation to the left and lateral bending to the right secondary to pain.  Normal extension, rotation to the right and lateral bending to the left.  No bony tenderness noted over the lumbar spine.  Pain with palpation of the right paralumbar muscles.  Strength 5/5 BLE.  Able to stand on tiptoes and heels.  No difficulty with gait. Neurological: Alert and oriented.    BMET    Component Value Date/Time   NA 136 06/16/2021 0754   K 4.3 06/16/2021 0754   CL 102 06/16/2021 0754   CO2 26 06/16/2021 0754   GLUCOSE 98 06/16/2021 0754   BUN 12 06/16/2021 0754   CREATININE 1.25 (H) 06/16/2021 0754   CALCIUM 10.2 06/16/2021 0754   GFRNONAA 56 (L) 06/16/2021 0754   GFRAA >60 05/04/2020 1048    Lipid Panel     Component Value Date/Time   CHOL 238 (H) 06/08/2020 0957   TRIG 167.0 (H) 06/08/2020 0957   HDL 47.70 06/08/2020 0957   CHOLHDL 5 06/08/2020 0957   VLDL 33.4 06/08/2020 0957    LDLCALC 156 (H) 06/08/2020 0957    CBC    Component Value Date/Time   WBC 7.6 06/16/2021 0754   RBC 4.44 06/16/2021 0754   HGB 13.7 06/16/2021 0754   HGB 10.9 (L) 11/24/2011 1421   HCT 40.6 06/16/2021 0754   HCT 26.7 (L) 11/26/2011 0610   PLT 346 06/16/2021 0754   PLT 276 11/24/2011 1421   MCV 91.4 06/16/2021 0754   MCV 85 11/24/2011 1421   MCH 30.9 06/16/2021 0754   MCHC 33.7 06/16/2021 0754   RDW 14.2 06/16/2021 0754   RDW 16.1 (H) 11/24/2011 1421   LYMPHSABS 3.3 06/16/2021 0754   LYMPHSABS  1.8 11/24/2011 1421   MONOABS 0.5 06/16/2021 0754   MONOABS 0.5 11/24/2011 1421   EOSABS 0.3 06/16/2021 0754   EOSABS 0.2 11/24/2011 1421   BASOSABS 0.1 06/16/2021 0754   BASOSABS 0.0 11/24/2011 1421    Hgb A1C Lab Results  Component Value Date   HGBA1C 5.7 06/08/2020           Assessment & Plan:   UC/ER follow-up for Migraine, Acute Right Side Back Pain:  Urgent care and ER notes and labs reviewed Migraine has resolved Will obtain x-ray lumbar spine today Urinalysis obtained Could consider referral to PT if x-ray is normal  We will follow-up after labs and imaging with further recommendations and treatment plan. Nicki Reaper, NP This visit occurred during the SARS-CoV-2 public health emergency.  Safety protocols were in place, including screening questions prior to the visit, additional usage of staff PPE, and extensive cleaning of exam room while observing appropriate contact time as indicated for disinfecting solutions.

## 2021-11-14 ENCOUNTER — Other Ambulatory Visit: Payer: Self-pay

## 2021-12-10 IMAGING — CT CT ANGIO CHEST
2 of 6 series · 18 of 46 positions shown · IV contrast (APPLIED)
Comparison: Same day chest radiograph

CLINICAL DATA: Lower back pain positive D-dimer

EXAM:
CT ANGIOGRAPHY CHEST WITH CONTRAST
TECHNIQUE: Multidetector CT imaging of the chest was performed using the
standard protocol during bolus administration of intravenous
contrast. Multiplanar CT image reconstructions and MIPs were
obtained to evaluate the vascular anatomy.
CONTRAST:  75mL OMNIPAQUE IOHEXOL 350 MG/ML SOLN

[Series 5: thins · axial · 0.69mm/px · z∈[-297,-76]mm · 16 of 243 slices shown]
[im 11/243  lung]
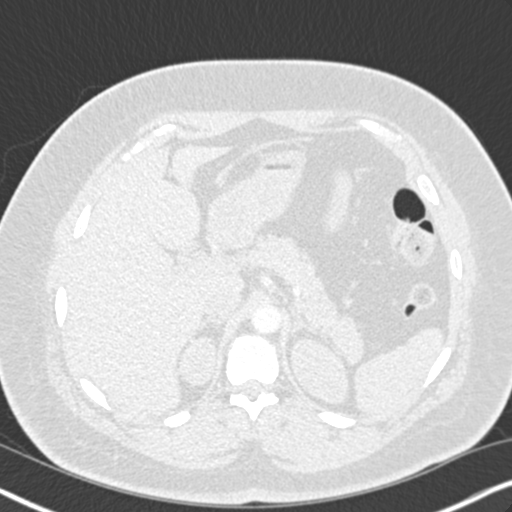
[im 32/243  soft-tissue]
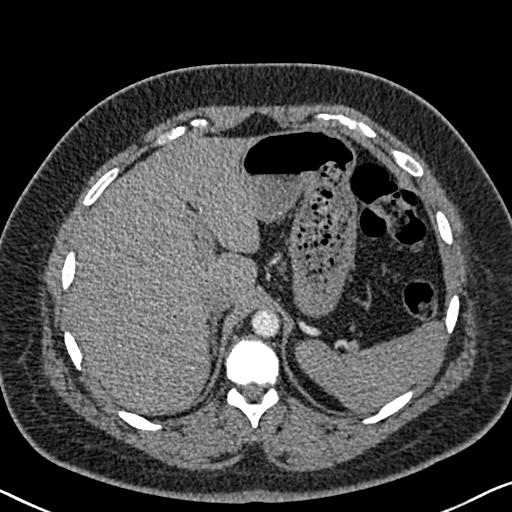
[im 43/243  lung]
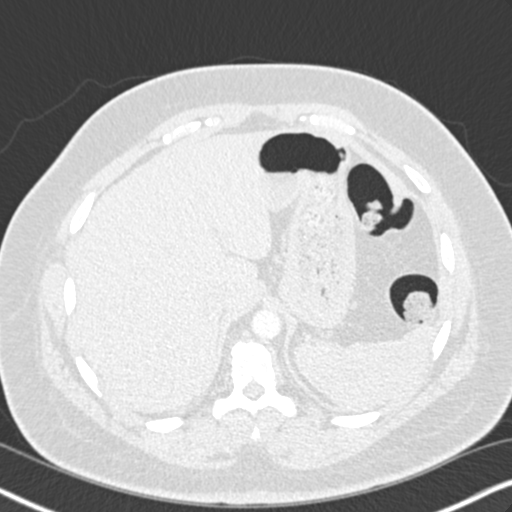
[im 53/243  soft-tissue]
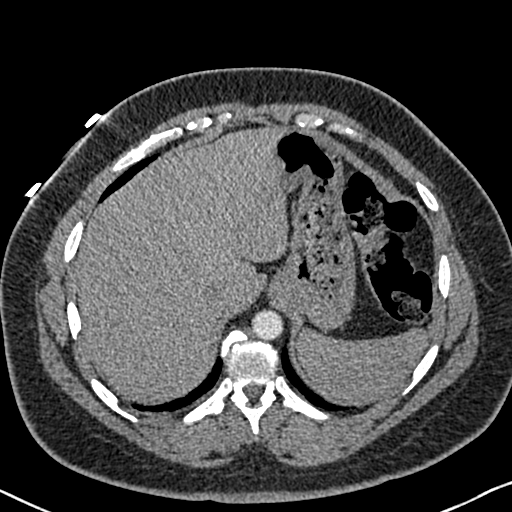
[im 74/243  lung]
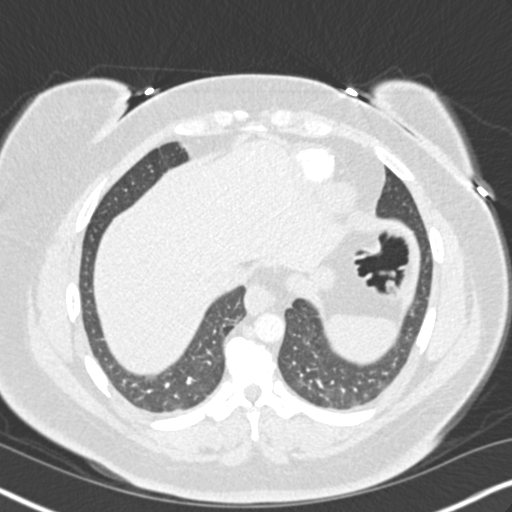
[im 85/243  soft-tissue]
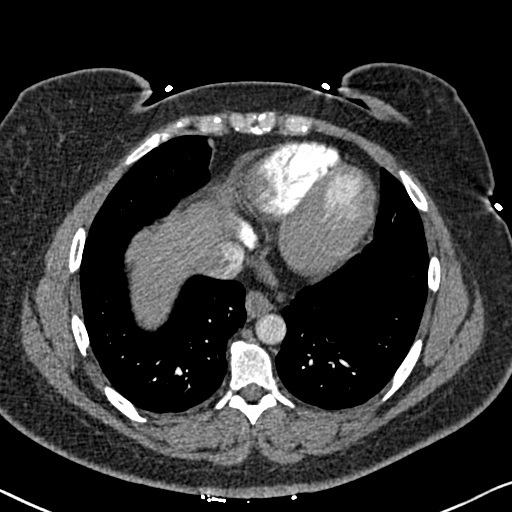
[im 95/243  lung]
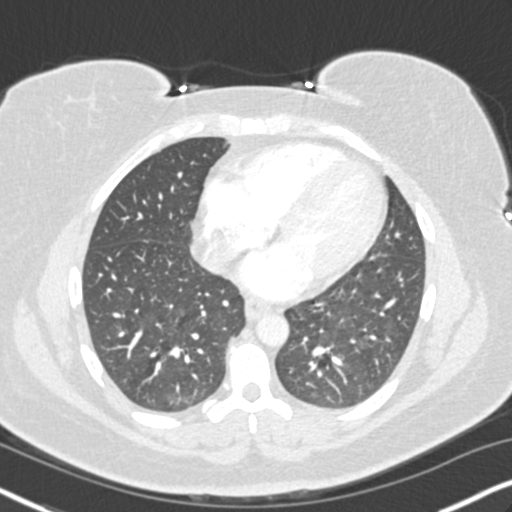
[im 116/243  soft-tissue]
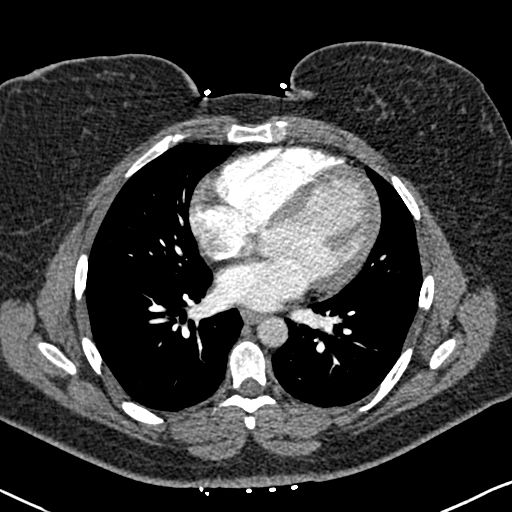
[im 127/243  lung]
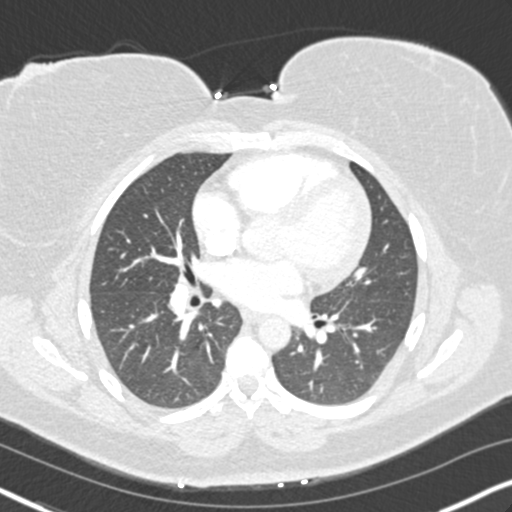
[im 148/243  soft-tissue]
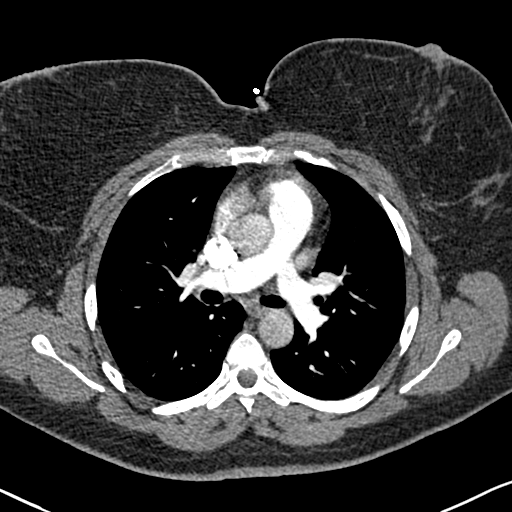
[im 158/243  lung]
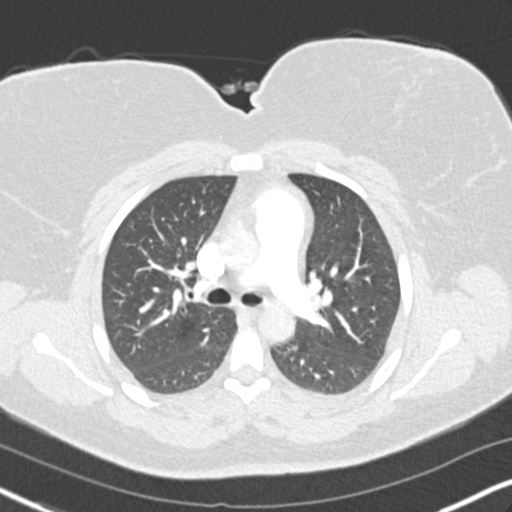
[im 169/243  soft-tissue]
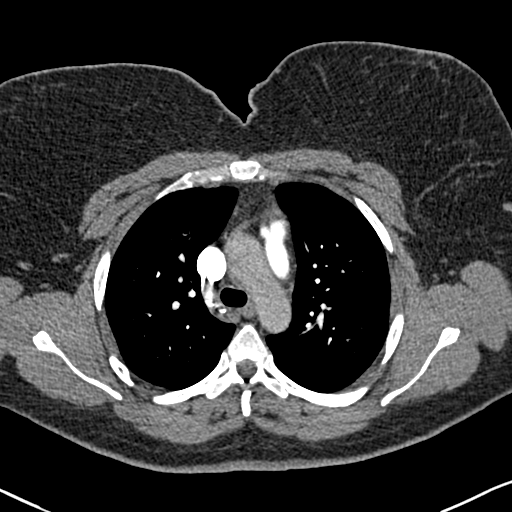
[im 190/243  lung]
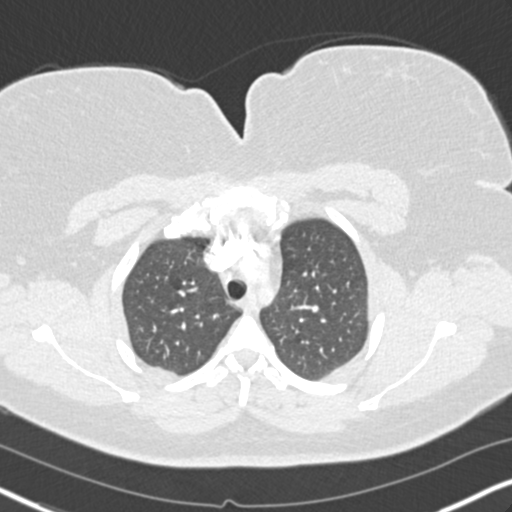
[im 200/243  soft-tissue]
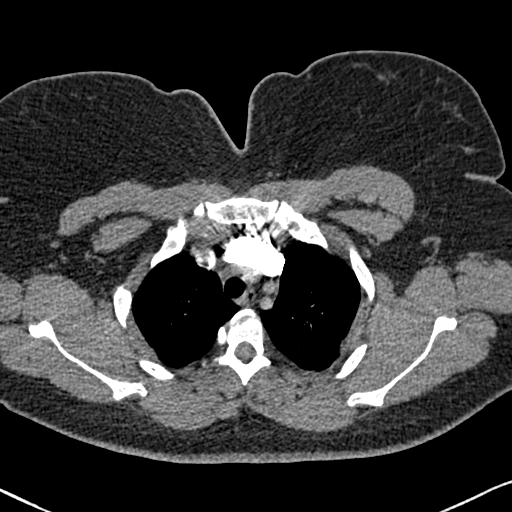
[im 211/243  lung]
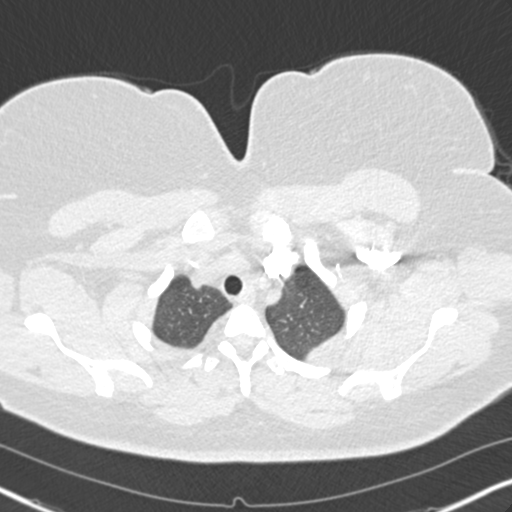
[im 232/243  soft-tissue]
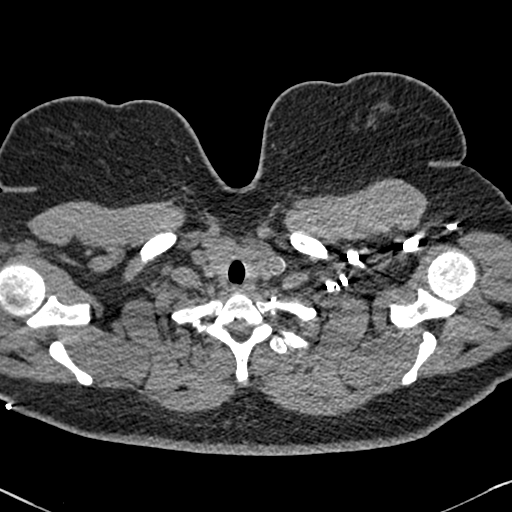

[Series 7: coronal mpr · coronal · 0.54mm/px · 2 of 94 slices shown]
[im 32/94  soft-tissue]
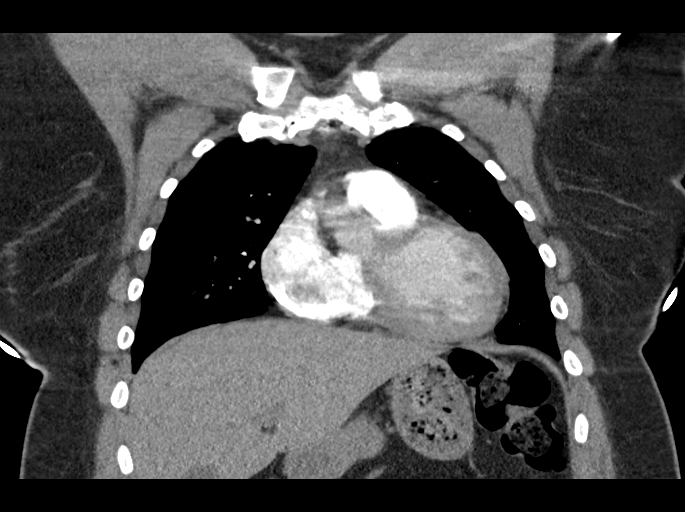
[im 63/94  soft-tissue]
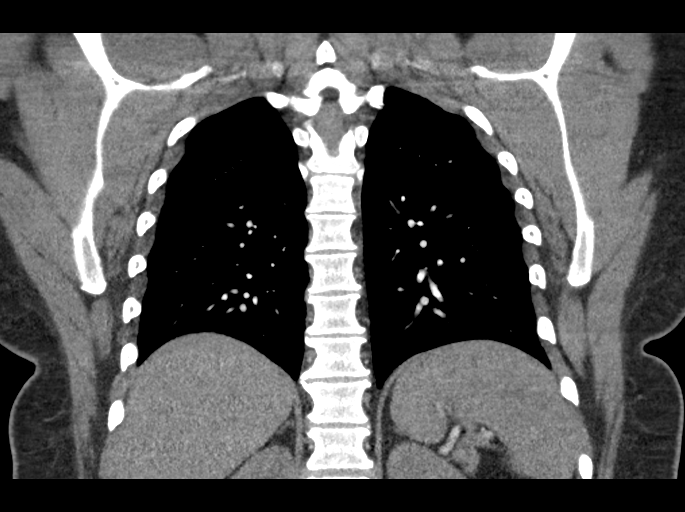

[18 of 46 positions shown; findings below may reference images not displayed]

FINDINGS: Cardiovascular: There is adequate opacification of the pulmonary
arteries to the segmental level. There is no evidence of pulmonary
embolism.

The heart is not enlarged.  There is no pericardial effusion.

Mediastinum/Nodes: The imaged thyroid is unremarkable. There is no
mediastinal, hilar, or axillary lymphadenopathy. There is a small
hiatal hernia. The esophagus is otherwise grossly unremarkable.

Lungs/Pleura: The trachea and central airways are patent.

The lungs are clear, with no focal consolidation or pulmonary edema.
There is no pleural effusion or pneumothorax. There are no
suspicious nodules.

Upper Abdomen: The imaged portions of the upper abdominal viscera
are unremarkable.

Musculoskeletal: There is no acute osseous abnormality or aggressive
osseous lesion. There is mild degenerative change in the midthoracic
spine.

Review of the MIP images confirms the above findings.
IMPRESSION: No evidence of pulmonary embolism or other acute pathology in the
chest.

## 2022-06-30 ENCOUNTER — Encounter: Payer: Self-pay | Admitting: Internal Medicine

## 2022-06-30 ENCOUNTER — Encounter: Payer: 59 | Admitting: Internal Medicine

## 2022-06-30 ENCOUNTER — Ambulatory Visit (INDEPENDENT_AMBULATORY_CARE_PROVIDER_SITE_OTHER): Payer: 59 | Admitting: Internal Medicine

## 2022-06-30 ENCOUNTER — Other Ambulatory Visit (HOSPITAL_COMMUNITY)
Admission: RE | Admit: 2022-06-30 | Discharge: 2022-06-30 | Disposition: A | Discharge status: Home / Self Care | Payer: 59 | Source: Ambulatory Visit | Attending: Internal Medicine | Admitting: Internal Medicine

## 2022-06-30 VITALS — BP 134/86 | HR 87 | Temp 97.1°F | Ht 65.0 in | Wt 240.0 lb

## 2022-06-30 DIAGNOSIS — Z113 Encounter for screening for infections with a predominantly sexual mode of transmission: Secondary | ICD-10-CM | POA: Insufficient documentation

## 2022-06-30 DIAGNOSIS — E782 Mixed hyperlipidemia: Secondary | ICD-10-CM | POA: Diagnosis not present

## 2022-06-30 DIAGNOSIS — F32A Depression, unspecified: Secondary | ICD-10-CM | POA: Diagnosis not present

## 2022-06-30 DIAGNOSIS — Z1231 Encounter for screening mammogram for malignant neoplasm of breast: Secondary | ICD-10-CM

## 2022-06-30 DIAGNOSIS — Z23 Encounter for immunization: Secondary | ICD-10-CM | POA: Diagnosis not present

## 2022-06-30 DIAGNOSIS — H9313 Tinnitus, bilateral: Secondary | ICD-10-CM

## 2022-06-30 DIAGNOSIS — F419 Anxiety disorder, unspecified: Secondary | ICD-10-CM

## 2022-06-30 DIAGNOSIS — Z6839 Body mass index (BMI) 39.0-39.9, adult: Secondary | ICD-10-CM

## 2022-06-30 DIAGNOSIS — Z0001 Encounter for general adult medical examination with abnormal findings: Secondary | ICD-10-CM | POA: Diagnosis not present

## 2022-06-30 DIAGNOSIS — H9193 Unspecified hearing loss, bilateral: Secondary | ICD-10-CM

## 2022-06-30 DIAGNOSIS — R7303 Prediabetes: Secondary | ICD-10-CM | POA: Diagnosis not present

## 2022-06-30 MED ORDER — SERTRALINE HCL 25 MG PO TABS: 25.0000 mg | ORAL_TABLET | Freq: Every day | ORAL | 1 refills | Status: DC

## 2022-06-30 NOTE — Patient Instructions (Signed)

## 2022-06-30 NOTE — Assessment & Plan Note (Signed)
PHQ-9 score of 21 We will start sertraline 25 mg daily Support offered

## 2022-06-30 NOTE — Progress Notes (Signed)
Subjective:    Patient ID: Virginia Harris, female    DOB: [birth year], 41 y.o.   MRN: [id number]  HPI  Patient presents to clinic today for her annual exam.  Flu: 05/2022 Tetanus: 05/2012 COVID: Pfizer x1 Pap smear: 02/2019 Mammogram: Never Vision screening: annually Dentist: biannually  Diet: She does eat some lean meat. She consumes some fruit and veggies. She does eat some fried foods. She drinks mostly water. Exercise: Walking  Review of Systems  Past Medical History:  Diagnosis Date   Asthma    controlled   DVT (deep venous thrombosis) ([place]) 2021    Current Outpatient Medications  Medication Sig Dispense Refill   methocarbamol (ROBAXIN) 500 MG tablet Take by mouth.     naproxen (NAPROSYN) 500 MG tablet Take 1 tablet (500 mg total) by mouth 2 (two) times daily with a meal. 30 tablet 0   No current facility-administered medications for this visit.    Allergies  Allergen Reactions   Fruit Extracts Hives    strawberries    Family History  Problem Relation Age of Onset   Breast cancer Maternal Grandmother     Social History   Socioeconomic History   Marital status: Single    Spouse name: Not on file   Number of children: 4   Years of education: Not on file   Highest education level: Not on file  Occupational History   Not on file  Tobacco Use   Smoking status: Never   Smokeless tobacco: Never  Vaping Use   Vaping Use: Never used  Substance and Sexual Activity   Alcohol use: Yes    Comment: occasional   Drug use: No   Sexual activity: Yes    Birth control/protection: None  Other Topics Concern   Not on file  Social History Narrative   Not on file   Social Determinants of Health   Financial Resource Strain: Not on file  Food Insecurity: Not on file  Transportation Needs: Not on file  Physical Activity: Not on file  Stress: Not on file  Social Connections: Not on file  Intimate Partner Violence: Not on file     Constitutional: Denies  fever, malaise, fatigue, headache or abrupt weight changes.  HEENT: Pt reports decreased hearing, ringing in ears. Denies eye pain, eye redness, ear pain, wax buildup, runny nose, nasal congestion, bloody nose, or sore throat. Respiratory: Denies difficulty breathing, shortness of breath, cough or sputum production.   Cardiovascular: Denies chest pain, chest tightness, palpitations or swelling in the hands or feet.  Gastrointestinal: Denies abdominal pain, bloating, constipation, diarrhea or blood in the stool.  GU: Denies urgency, frequency, pain with urination, burning sensation, blood in urine, odor or discharge. Musculoskeletal: Denies decrease in range of motion, difficulty with gait, muscle pain or joint pain and swelling.  Skin: Denies redness, rashes, lesions or ulcercations.  Neurological: Denies dizziness, difficulty with memory, difficulty with speech or problems with balance and coordination.  Psych: Pt reports anxiety and depression. Denies  SI/HI.  No other specific complaints in a complete review of systems (except as listed in HPI above).     Objective:   Physical Exam  BP 134/86 (BP Location: Right Arm, Patient Position: Sitting, Cuff Size: Normal)   Pulse 87   Temp (!) 97.1 F (36.2 C) (Temporal)   Ht _0  (1.651 m)   Wt 240 lb (108.9 kg)   SpO2 99%   BMI 39.94 kg/m   Wt Readings from Last 3 Encounters:  06/20/21 233 lb 12.8 oz (106.1 kg)  06/16/21 229 lb 4.5 oz (104 kg)  06/13/21 230 lb (104.3 kg)    General: Appears her stated age, obese, in NAD. Skin: Warm, dry and intact. No rashes, lesions or ulcerations noted. HEENT: Head: normal shape and size; Eyes: sclera white, no icterus, conjunctiva pink, PERRLA and EOMs intact; Ears: Tm's gray and intact, normal light reflex, normal Weber, normal Rinne;  Neck:  Neck supple, trachea midline. No masses, lumps present. Thyromegaly noted. Cardiovascular: Normal rate and rhythm. S1,S2 noted.  No murmur, rubs or gallops  noted. No JVD or BLE edema.  Pulmonary/Chest: Normal effort and positive vesicular breath sounds. No respiratory distress. No wheezes, rales or ronchi noted.  Abdomen:  Normal bowel sound Musculoskeletal: Strength 5/5 BUE/BLE. No difficulty with gait.  Neurological: Alert and oriented. Cranial nerves II-XII grossly intact. Coordination normal.  Psychiatric: Mood and affect mildly flat. Behavior is normal. Judgment and thought content normal.     BMET    Component Value Date/Time   NA 136 06/16/2021 0754   K 4.3 06/16/2021 0754   CL 102 06/16/2021 0754   CO2 26 06/16/2021 0754   GLUCOSE 98 06/16/2021 0754   BUN 12 06/16/2021 0754   CREATININE 1.25 (H) 06/16/2021 0754   CALCIUM 10.2 06/16/2021 0754   GFRNONAA 56 (L) 06/16/2021 0754   GFRAA >60 05/04/2020 1048    Lipid Panel     Component Value Date/Time   CHOL 238 (H) 06/08/2020 0957   TRIG 167.0 (H) 06/08/2020 0957   HDL 47.70 06/08/2020 0957   CHOLHDL 5 06/08/2020 0957   VLDL 33.4 06/08/2020 0957   LDLCALC 156 (H) 06/08/2020 0957    CBC    Component Value Date/Time   WBC 7.6 06/16/2021 0754   RBC 4.44 06/16/2021 0754   HGB 13.7 06/16/2021 0754   HGB 10.9 (L) 11/24/2011 1421   HCT 40.6 06/16/2021 0754   HCT 26.7 (L) 11/26/2011 0610   PLT 346 06/16/2021 0754   PLT 276 11/24/2011 1421   MCV 91.4 06/16/2021 0754   MCV 85 11/24/2011 1421   MCH 30.9 06/16/2021 0754   MCHC 33.7 06/16/2021 0754   RDW 14.2 06/16/2021 0754   RDW 16.1 (H) 11/24/2011 1421   LYMPHSABS 3.3 06/16/2021 0754   LYMPHSABS 1.8 11/24/2011 1421   MONOABS 0.5 06/16/2021 0754   MONOABS 0.5 11/24/2011 1421   EOSABS 0.3 06/16/2021 0754   EOSABS 0.2 11/24/2011 1421   BASOSABS 0.1 06/16/2021 0754   BASOSABS 0.0 11/24/2011 1421    Hgb A1C Lab Results  Component Value Date   HGBA1C 5.7 06/08/2020           Assessment & Plan:   Preventative Health Maintenance:  Flu shot UTD Tdap today Encouraged her to get her COVID booster Pap smear  UTD Mammogram ordered-she will call to schedule Encouraged her to consume a balanced diet and exercise regimen Advised her to see an eye doctor and dentist annually We will check CBC, c-Met, lipid, A1c and hep C today  Decreased Hearing, Tinnitus:  Your exam and hearing test normal in office Referral to audiology per patient request  RTC in 6 months, follow-up chronic conditions Webb Silversmith, NP

## 2022-06-30 NOTE — Assessment & Plan Note (Signed)
Encourage diet and exercise for weight loss 

## 2022-07-03 LAB — RPR: RPR Ser Ql: NONREACTIVE

## 2022-07-03 LAB — COMPLETE METABOLIC PANEL WITH GFR
AG Ratio: 1.8 (calc) (ref 1.0–2.5)
ALT: 16 U/L (ref 6–29)
AST: 19 U/L (ref 10–30)
Albumin: 4.4 g/dL (ref 3.6–5.1)
Alkaline phosphatase (APISO): 72 U/L (ref 31–125)
BUN/Creatinine Ratio: 7 (calc) (ref 6–22)
BUN: 8 mg/dL (ref 7–25)
CO2: 24 mmol/L (ref 20–32)
Calcium: 10.7 mg/dL — ABNORMAL HIGH (ref 8.6–10.2)
Chloride: 106 mmol/L (ref 98–110)
Creat: 1.13 mg/dL — ABNORMAL HIGH (ref 0.50–0.99)
Globulin: 2.5 g/dL (calc) (ref 1.9–3.7)
Glucose, Bld: 91 mg/dL (ref 65–99)
Potassium: 5 mmol/L (ref 3.5–5.3)
Sodium: 138 mmol/L (ref 135–146)
Total Bilirubin: 0.4 mg/dL (ref 0.2–1.2)
Total Protein: 6.9 g/dL (ref 6.1–8.1)
eGFR: 63 mL/min/{1.73_m2} (ref >=60)

## 2022-07-03 LAB — CBC
HCT: 39.4 % (ref 35.0–45.0)
Hemoglobin: 13.4 g/dL (ref 11.7–15.5)
MCH: 30.9 pg (ref 27.0–33.0)
MCHC: 34 g/dL (ref 32.0–36.0)
MCV: 90.8 fL (ref 80.0–100.0)
MPV: 10.5 fL (ref 7.5–12.5)
Platelets: 339 10*3/uL (ref 140–400)
RBC: 4.34 10*6/uL (ref 3.80–5.10)
RDW: 13.9 % (ref 11.0–15.0)
WBC: 7.6 10*3/uL (ref 3.8–10.8)

## 2022-07-03 LAB — HEMOGLOBIN A1C
Hgb A1c MFr Bld: 5.6 % of total Hgb (ref <5.7)
Mean Plasma Glucose: 114 mg/dL
eAG (mmol/L): 6.3 mmol/L

## 2022-07-03 LAB — HEPATITIS C ANTIBODY: Hepatitis C Ab: NONREACTIVE

## 2022-07-03 LAB — LIPID PANEL
Cholesterol: 227 mg/dL — ABNORMAL HIGH (ref <200)
HDL: 85 mg/dL (ref >=50)
LDL Cholesterol (Calc): 116 mg/dL (calc) — ABNORMAL HIGH
Non-HDL Cholesterol (Calc): 142 mg/dL (calc) — ABNORMAL HIGH (ref <130)
Total CHOL/HDL Ratio: 2.7 (calc) (ref <5.0)
Triglycerides: 143 mg/dL (ref <150)

## 2022-07-03 LAB — CERVICOVAGINAL ANCILLARY ONLY
Chlamydia: NEGATIVE
Comment: NEGATIVE
Comment: NEGATIVE
Comment: NORMAL
Neisseria Gonorrhea: NEGATIVE
Trichomonas: NEGATIVE

## 2022-07-03 LAB — HIV ANTIBODY (ROUTINE TESTING W REFLEX): HIV 1&2 Ab, 4th Generation: NONREACTIVE

## 2022-07-04 ENCOUNTER — Encounter: Payer: Self-pay | Admitting: Internal Medicine

## 2022-07-10 ENCOUNTER — Ambulatory Visit: Payer: 59 | Admitting: Internal Medicine

## 2022-07-11 ENCOUNTER — Ambulatory Visit: Payer: 59 | Admitting: Internal Medicine

## 2022-07-11 ENCOUNTER — Encounter: Payer: Self-pay | Admitting: Internal Medicine

## 2022-07-11 ENCOUNTER — Other Ambulatory Visit (HOSPITAL_COMMUNITY)
Admission: RE | Admit: 2022-07-11 | Discharge: 2022-07-11 | Disposition: A | Discharge status: Home / Self Care | Payer: 59 | Source: Ambulatory Visit | Attending: Internal Medicine | Admitting: Internal Medicine

## 2022-07-11 VITALS — BP 128/86 | HR 73 | Temp 96.9°F | Wt 241.0 lb

## 2022-07-11 DIAGNOSIS — N898 Other specified noninflammatory disorders of vagina: Secondary | ICD-10-CM | POA: Diagnosis not present

## 2022-07-11 NOTE — Patient Instructions (Signed)

## 2022-07-11 NOTE — Progress Notes (Signed)
Subjective:    Patient ID: Virginia Harris, female    DOB: 1981-04-10, 41 y.o.   MRN: [id number]  HPI  Patient presents to clinic today with complaint of vaginal discharge and odor.  This started 2 weeks. She describes the discharge as thin and white and has a fishy odor. She denies pelvic pain, urinary symptoms or urinary symptoms.  She recently had a negative STD screen but would like to be checked for bacterial vaginosis.  Review of Systems     Past Medical History:  Diagnosis Date   Asthma    controlled   DVT (deep venous thrombosis) ([place]) 2021    Current Outpatient Medications  Medication Sig Dispense Refill   sertraline (ZOLOFT) 25 MG tablet Take 1 tablet (25 mg total) by mouth daily. 90 tablet 1   No current facility-administered medications for this visit.    Allergies  Allergen Reactions   Fruit Extracts Hives    strawberries    Family History  Problem Relation Age of Onset   Diabetes Mother    Breast cancer Maternal Grandmother     Social History   Socioeconomic History   Marital status: Single    Spouse name: Not on file   Number of children: 4   Years of education: Not on file   Highest education level: Not on file  Occupational History   Not on file  Tobacco Use   Smoking status: Never   Smokeless tobacco: Never  Vaping Use   Vaping Use: Never used  Substance and Sexual Activity   Alcohol use: Yes    Comment: occasional   Drug use: No   Sexual activity: Yes    Birth control/protection: None  Other Topics Concern   Not on file  Social History Narrative   Not on file   Social Determinants of Health   Financial Resource Strain: Not on file  Food Insecurity: Not on file  Transportation Needs: Not on file  Physical Activity: Not on file  Stress: Not on file  Social Connections: Not on file  Intimate Partner Violence: Not on file     Constitutional: Denies fever, malaise, fatigue, headache or abrupt weight changes.  Respiratory:  Denies difficulty breathing, shortness of breath, cough or sputum production.   Cardiovascular: Denies chest pain, chest tightness, palpitations or swelling in the hands or feet.  Gastrointestinal: Denies abdominal pain, bloating, constipation, diarrhea or blood in the stool.  GU: Patient reports vaginal discharge and odor.  Denies urgency, frequency, pain with urination, burning sensation, blood in urine.  No other specific complaints in a complete review of systems (except as listed in HPI above).  Objective:   Physical Exam  BP 128/86 (BP Location: Right Arm, Patient Position: Sitting, Cuff Size: Large)   Pulse 73   Temp (!) 96.9 F (36.1 C) (Temporal)   Wt 241 lb (109.3 kg)   SpO2 99%   BMI 40.10 kg/m   Wt Readings from Last 3 Encounters:  06/30/22 240 lb (108.9 kg)  06/20/21 233 lb 12.8 oz (106.1 kg)  06/16/21 229 lb 4.5 oz (104 kg)    General: Appears her stated age, obese, in NAD. Cardiovascular: Normal rate and rhythm. S1,S2 noted.  No murmur, rubs or gallops noted.  Pulmonary/Chest: Normal effort and positive vesicular breath sounds. No respiratory distress. No wheezes, rales or ronchi noted.  Pelvic: Self swabbed. Neurological: Alert and oriented.   BMET    Component Value Date/Time   NA 138 06/30/2022 1128   K  5.0 06/30/2022 1128   CL 106 06/30/2022 1128   CO2 24 06/30/2022 1128   GLUCOSE 91 06/30/2022 1128   BUN 8 06/30/2022 1128   CREATININE 1.13 (H) 06/30/2022 1128   CALCIUM 10.7 (H) 06/30/2022 1128   GFRNONAA 56 (L) 06/16/2021 0754   GFRAA >60 05/04/2020 1048    Lipid Panel     Component Value Date/Time   CHOL 227 (H) 06/30/2022 1128   TRIG 143 06/30/2022 1128   HDL 85 06/30/2022 1128   CHOLHDL 2.7 06/30/2022 1128   VLDL 33.4 06/08/2020 0957   LDLCALC 116 (H) 06/30/2022 1128    CBC    Component Value Date/Time   WBC 7.6 06/30/2022 1128   RBC 4.34 06/30/2022 1128   HGB 13.4 06/30/2022 1128   HGB 10.9 (L) 11/24/2011 1421   HCT 39.4  06/30/2022 1128   HCT 26.7 (L) 11/26/2011 0610   PLT 339 06/30/2022 1128   PLT 276 11/24/2011 1421   MCV 90.8 06/30/2022 1128   MCV 85 11/24/2011 1421   MCH 30.9 06/30/2022 1128   MCHC 34.0 06/30/2022 1128   RDW 13.9 06/30/2022 1128   RDW 16.1 (H) 11/24/2011 1421   LYMPHSABS 3.3 06/16/2021 0754   LYMPHSABS 1.8 11/24/2011 1421   MONOABS 0.5 06/16/2021 0754   MONOABS 0.5 11/24/2011 1421   EOSABS 0.3 06/16/2021 0754   EOSABS 0.2 11/24/2011 1421   BASOSABS 0.1 06/16/2021 0754   BASOSABS 0.0 11/24/2011 1421    Hgb A1C Lab Results  Component Value Date   HGBA1C 5.6 06/30/2022           Assessment & Plan:   Vaginal Discharge and Odor:  Wet prep to check for BV and yeast  RTC in 5 months for follow-up of chronic conditions Nicki Reaper, NP

## 2022-07-13 LAB — CERVICOVAGINAL ANCILLARY ONLY
Bacterial Vaginitis (gardnerella): POSITIVE — AB
Candida Glabrata: NEGATIVE
Candida Vaginitis: NEGATIVE
Comment: NEGATIVE
Comment: NEGATIVE
Comment: NEGATIVE

## 2022-07-13 MED ORDER — METRONIDAZOLE 0.75 % EX GEL: 1.0000 | Freq: Two times a day (BID) | CUTANEOUS | 0 refills | Status: DC

## 2022-07-13 NOTE — Addendum Note (Signed)
Addended by: Lorre Munroe on: 07/13/2022 01:32 PM   Modules accepted: Orders

## 2022-07-14 ENCOUNTER — Ambulatory Visit: Payer: Self-pay

## 2022-07-14 ENCOUNTER — Telehealth: Payer: Self-pay | Admitting: Internal Medicine

## 2022-07-14 NOTE — Telephone Encounter (Signed)
Message from Glean Salen sent at 07/14/2022 10:07 AM EST  Summary: med ?   Patient called in wanting to clarify if metroNIDAZOLE (METROGEL) 0.75 % gel is supposed to be the gel form or is it supposed ot be like a suppository. Please call back        Per result note of vaginal swab PCP wrote to call in suppositories. Instead ordered as a topical gel. Please clarify order. Reason for Disposition  [1] Pharmacy calling with prescription question AND [2] triager unable to answer question  Answer Assessment - Initial Assessment Questions 1. NAME of MEDICINE: "What medicine(s) are you calling about?"     Metrogel 2. QUESTION: "What is your question?" (e.g., double dose of medicine, side effect)     Is the Metrogel supposed to be a suppository ? 3. PRESCRIBER: "Who prescribed the medicine?" Reason: if prescribed by specialist, call should be referred to that group.     PCP 4. SYMPTOMS: "Do you have any symptoms?" If Yes, ask: "What symptoms are you having?"  "How bad are the symptoms (e.g., mild, moderate, severe)     Dx with BV 5. PREGNANCY:  "Is there any chance that you are pregnant?" "When was your last menstrual period?"     N/a  Protocols used: Medication Question Call-A-AH

## 2022-07-14 NOTE — Telephone Encounter (Deleted)
Patient called in wanting to clarify if metroNIDAZOLE (METROGEL) 0.75 % gel is supposed to be the gel form or is it supposed ot be like a suppository. Please call back

## 2022-07-14 NOTE — Telephone Encounter (Signed)
It should have come with an applicator

## 2022-07-17 MED ORDER — METRONIDAZOLE 0.75 % VA GEL: 1.0000 | Freq: Two times a day (BID) | VAGINAL | 0 refills | Status: DC

## 2022-07-17 NOTE — Telephone Encounter (Signed)
Sent to pharmacy 

## 2022-07-17 NOTE — Telephone Encounter (Signed)
Pt called saying the rx that was sent was the topical one and she needs to resend the one with the applicator.  [facility] [address]  Royann Shivers  [phone]

## 2022-07-17 NOTE — Addendum Note (Signed)
Addended by: Lorre Munroe on: 07/17/2022 12:03 PM   Modules accepted: Orders

## 2022-08-03 ENCOUNTER — Telehealth: Payer: Self-pay

## 2022-08-03 NOTE — Telephone Encounter (Signed)
Copied from CRM [id number]. Topic: Referral - Status >> Aug 02, 2022  3:56 PM Lyman Speller wrote: Reason for CRM: [facility] called and they advised that they have called the pt a couple times and haventr reach her or received a call back/ please advise pt

## 2022-08-03 NOTE — Telephone Encounter (Signed)
FYI

## 2022-08-04 NOTE — Telephone Encounter (Signed)
noted 

## 2022-08-11 ENCOUNTER — Ambulatory Visit
Admission: RE | Admit: 2022-08-11 | Discharge: 2022-08-11 | Disposition: A | Discharge status: Home / Self Care | Payer: 59 | Source: Ambulatory Visit | Attending: Internal Medicine | Admitting: Internal Medicine

## 2022-08-11 DIAGNOSIS — Z1231 Encounter for screening mammogram for malignant neoplasm of breast: Secondary | ICD-10-CM | POA: Diagnosis not present

## 2022-08-29 ENCOUNTER — Other Ambulatory Visit (HOSPITAL_COMMUNITY)
Admission: RE | Admit: 2022-08-29 | Discharge: 2022-08-29 | Disposition: A | Discharge status: Home / Self Care | Payer: 59 | Source: Ambulatory Visit | Attending: Internal Medicine | Admitting: Internal Medicine

## 2022-08-29 ENCOUNTER — Ambulatory Visit: Payer: 59 | Admitting: Internal Medicine

## 2022-08-29 ENCOUNTER — Encounter: Payer: Self-pay | Admitting: Internal Medicine

## 2022-08-29 VITALS — BP 131/72 | HR 103 | Ht 65.0 in | Wt 248.0 lb

## 2022-08-29 DIAGNOSIS — L700 Acne vulgaris: Secondary | ICD-10-CM | POA: Diagnosis not present

## 2022-08-29 DIAGNOSIS — N898 Other specified noninflammatory disorders of vagina: Secondary | ICD-10-CM | POA: Diagnosis not present

## 2022-08-29 MED ORDER — CLINDAMYCIN PHOS-BENZOYL PEROX 1-5 % EX GEL: Freq: Two times a day (BID) | CUTANEOUS | 0 refills | Status: DC

## 2022-08-29 NOTE — Patient Instructions (Signed)

## 2022-08-29 NOTE — Progress Notes (Signed)
Subjective:    Patient ID: Virginia Harris, female    DOB: 03-Jan-1981, 41 y.o.   MRN: [id number]  HPI  Patient presents to clinic today with complaint of vaginal discharge and odor.  This has been an ongoing concern for at least 6 weeks.  She reports the discharge is clear but has a fishy odor.  She denies pelvic pain, vaginal itching, vaginal irritation, abnormal vaginal bleeding.  She denies urinary symptoms.  She would like to be tested for BV.  She was treated for the same 11/7.  She also reports an acne breakout on her chin.  She is using oil and cleanser OTC with minimal relief of symptoms.  Review of Systems     Past Medical History:  Diagnosis Date   Asthma    controlled   DVT (deep venous thrombosis) (HCC) 2021    Current Outpatient Medications  Medication Sig Dispense Refill   metroNIDAZOLE (METROGEL) 0.75 % vaginal gel Place 1 Applicatorful vaginally 2 (two) times daily. 70 g 0   metroNIDAZOLE (METROGEL) 0.75 % gel Apply 1 Application topically 2 (two) times daily. 45 g 0   sertraline (ZOLOFT) 25 MG tablet Take 1 tablet (25 mg total) by mouth daily. 90 tablet 1   No current facility-administered medications for this visit.    Allergies  Allergen Reactions   Fruit Extracts Hives    strawberries    Family History  Problem Relation Age of Onset   Diabetes Mother    Breast cancer Maternal Grandmother     Social History   Socioeconomic History   Marital status: Single    Spouse name: Not on file   Number of children: 4   Years of education: Not on file   Highest education level: Not on file  Occupational History   Not on file  Tobacco Use   Smoking status: Never   Smokeless tobacco: Never  Vaping Use   Vaping Use: Never used  Substance and Sexual Activity   Alcohol use: Yes    Comment: occasional   Drug use: No   Sexual activity: Yes    Birth control/protection: None  Other Topics Concern   Not on file  Social History Narrative   Not on file    Social Determinants of Health   Financial Resource Strain: Not on file  Food Insecurity: Not on file  Transportation Needs: Not on file  Physical Activity: Not on file  Stress: Not on file  Social Connections: Not on file  Intimate Partner Violence: Not on file     Constitutional: Denies fever, malaise, fatigue, headache or abrupt weight changes.  Respiratory: Denies difficulty breathing, shortness of breath, cough or sputum production.   Cardiovascular: Denies chest pain, chest tightness, palpitations or swelling in the hands or feet.  Gastrointestinal: Denies abdominal pain, bloating, constipation, diarrhea or blood in the stool.  GU: Patient reports vaginal discharge and odor.  Denies urgency, frequency, pain with urination, burning sensation, blood in urine. Skin: Patient reports acne on chin.  Denies ulcerations.    No other specific complaints in a complete review of systems (except as listed in HPI above).  Objective:   Physical Exam  BP 131/72   Pulse (!) 103   Ht 5\' 5"  (1.651 m)   Wt 248 lb (112.5 kg)   SpO2 100%   BMI 41.27 kg/m   Wt Readings from Last 3 Encounters:  07/11/22 241 lb (109.3 kg)  06/30/22 240 lb (108.9 kg)  06/20/21 233 lb 12.8  oz (106.1 kg)    General: Appears her stated age, obese, in NAD. Skin: Acne vulgaris noted over the chin. HEENT: Head: normal shape and size; Eyes: sclera white, no icterus, conjunctiva pink, PERRLA and EOMs intact; Cardiovascular: Tachycardic with normal rhythm.  Pulmonary/Chest: Normal effort and positive vesicular breath sounds. No respiratory distress. No wheezes, rales or ronchi noted.  Abdomen: Soft and nontender.  GU: Self swab. Musculoskeletal: No difficulty with gait.  Neurological: Alert and oriented.    BMET    Component Value Date/Time   NA 138 06/30/2022 1128   K 5.0 06/30/2022 1128   CL 106 06/30/2022 1128   CO2 24 06/30/2022 1128   GLUCOSE 91 06/30/2022 1128   BUN 8 06/30/2022 1128    CREATININE 1.13 (H) 06/30/2022 1128   CALCIUM 10.7 (H) 06/30/2022 1128   GFRNONAA 56 (L) 06/16/2021 0754   GFRAA >60 05/04/2020 1048    Lipid Panel     Component Value Date/Time   CHOL 227 (H) 06/30/2022 1128   TRIG 143 06/30/2022 1128   HDL 85 06/30/2022 1128   CHOLHDL 2.7 06/30/2022 1128   VLDL 33.4 06/08/2020 0957   LDLCALC 116 (H) 06/30/2022 1128    CBC    Component Value Date/Time   WBC 7.6 06/30/2022 1128   RBC 4.34 06/30/2022 1128   HGB 13.4 06/30/2022 1128   HGB 10.9 (L) 11/24/2011 1421   HCT 39.4 06/30/2022 1128   HCT 26.7 (L) 11/26/2011 0610   PLT 339 06/30/2022 1128   PLT 276 11/24/2011 1421   MCV 90.8 06/30/2022 1128   MCV 85 11/24/2011 1421   MCH 30.9 06/30/2022 1128   MCHC 34.0 06/30/2022 1128   RDW 13.9 06/30/2022 1128   RDW 16.1 (H) 11/24/2011 1421   LYMPHSABS 3.3 06/16/2021 0754   LYMPHSABS 1.8 11/24/2011 1421   MONOABS 0.5 06/16/2021 0754   MONOABS 0.5 11/24/2011 1421   EOSABS 0.3 06/16/2021 0754   EOSABS 0.2 11/24/2011 1421   BASOSABS 0.1 06/16/2021 0754   BASOSABS 0.0 11/24/2011 1421    Hgb A1C Lab Results  Component Value Date   HGBA1C 5.6 06/30/2022            Assessment & Plan:   Vaginal Discharge and Odor:  Will obtain wet prep Consider starting boric acid suppositories nightly for the next month  Acne:  Rx for BenzaClin cream daily  RTC in 4 months for follow-up chronic conditions Nicki Reaper, NP

## 2022-08-29 NOTE — Addendum Note (Signed)
Addended by: Lorre Munroe on: 08/29/2022 11:19 AM   Modules accepted: Orders

## 2022-08-30 LAB — CERVICOVAGINAL ANCILLARY ONLY
Bacterial Vaginitis (gardnerella): POSITIVE — AB
Candida Glabrata: NEGATIVE
Candida Vaginitis: NEGATIVE
Chlamydia: NEGATIVE
Comment: NEGATIVE
Comment: NEGATIVE
Comment: NEGATIVE
Comment: NEGATIVE
Comment: NEGATIVE
Comment: NORMAL
Neisseria Gonorrhea: NEGATIVE
Trichomonas: NEGATIVE

## 2022-08-31 MED ORDER — METRONIDAZOLE 500 MG PO TABS: 500.0000 mg | ORAL_TABLET | Freq: Two times a day (BID) | ORAL | 0 refills | Status: DC

## 2022-08-31 NOTE — Addendum Note (Signed)
Addended by: Lorre Munroe on: 08/31/2022 09:52 AM   Modules accepted: Orders

## 2022-12-14 DIAGNOSIS — H18613 Keratoconus, stable, bilateral: Secondary | ICD-10-CM | POA: Diagnosis not present

## 2023-03-30 ENCOUNTER — Other Ambulatory Visit: Payer: Self-pay | Admitting: Oncology

## 2023-03-30 DIAGNOSIS — Z006 Encounter for examination for normal comparison and control in clinical research program: Secondary | ICD-10-CM

## 2023-05-22 ENCOUNTER — Encounter: Payer: Self-pay | Admitting: Advanced Practice Midwife

## 2023-05-22 ENCOUNTER — Ambulatory Visit: Payer: Self-pay | Admitting: Advanced Practice Midwife

## 2023-05-22 DIAGNOSIS — Z9851 Tubal ligation status: Secondary | ICD-10-CM | POA: Insufficient documentation

## 2023-05-22 DIAGNOSIS — Z113 Encounter for screening for infections with a predominantly sexual mode of transmission: Secondary | ICD-10-CM

## 2023-05-22 DIAGNOSIS — T7411XS Adult physical abuse, confirmed, sequela: Secondary | ICD-10-CM

## 2023-05-22 DIAGNOSIS — T7411XA Adult physical abuse, confirmed, initial encounter: Secondary | ICD-10-CM | POA: Insufficient documentation

## 2023-05-22 LAB — WET PREP FOR TRICH, YEAST, CLUE
Trichomonas Exam: NEGATIVE
Yeast Exam: NEGATIVE

## 2023-05-22 LAB — HM HIV SCREENING LAB: HM HIV Screening: NEGATIVE

## 2023-05-22 LAB — HM HEPATITIS C SCREENING LAB: HM Hepatitis Screen: NEGATIVE

## 2023-05-22 MED ORDER — DOXYCYCLINE HYCLATE 100 MG PO TABS: 100.0000 mg | ORAL_TABLET | Freq: Two times a day (BID) | ORAL | Status: AC

## 2023-05-22 NOTE — Progress Notes (Signed)
Patient here for STD screening.Burt Knack, RN

## 2023-05-22 NOTE — Progress Notes (Signed)
[facility] Department  STI clinic/screening visit [address] [place] [address] [phone]  Subjective:  Virginia Harris is a 42 y.o. SBF nonsmoker G5P4 female being seen today for an STI screening visit. The patient reports they do not have symptoms.  Patient reports that they do not desire a pregnancy in the next year.   They reported they are not interested in discussing contraception today.    No LMP recorded (lmp unknown). Patient has had an ablation.  Patient has the following medical conditions:   Patient Active Problem List   Diagnosis Date Noted   History of tubal ligation 2022 05/22/2023   Mixed hyperlipidemia 06/30/2022   Anxiety and depression 06/30/2022   Class 2 obesity due to excess calories with body mass index (BMI) of 39.0 to 39.9  248 lbs 06/20/2021   Prediabetes 06/20/2021   CKD (chronic kidney disease) stage 3, GFR 30-59 ml/min (HCC) 06/20/2021    Chief Complaint  Patient presents with   SEXUALLY TRANSMITTED DISEASE    HPI  Patient reports here because partner cheated on her and has chlamydia and herpes (he was dx'd 05/11/23 elsewhere and treated 05/16/23).  Last sex 04/18/23 without condom; with partner x 11 mo. LMP 2022. BTL 2022. Last MJ 3 years ago. Last ETOH 05/18/23 (1 glass wine) 1-2x/wk.  Last pap 02/06/2019 neg.   Does the patient using douching products? No  Last HIV test per patient/review of record was No results found for: "HMHIVSCREEN"  Lab Results  Component Value Date   HIV NON-REACTIVE 06/30/2022   Patient reports last pap was  Lab Results  Component Value Date   DIAGPAP  02/06/2019    NEGATIVE FOR INTRAEPITHELIAL LESIONS OR MALIGNANCY.   No results found for: "SPECADGYN"  Screening for MPX risk: Does the patient have an unexplained rash? No Is the patient MSM? No Does the patient endorse multiple sex partners or anonymous sex partners? No Did the patient have close or sexual contact with a person diagnosed  with MPX? No Has the patient traveled outside the [place] where MPX is endemic? No Is there a high clinical suspicion for MPX-- evidenced by one of the following No  -Unlikely to be chickenpox  -Lymphadenopathy  -Rash that present in same phase of evolution on any given body part See flowsheet for further details and programmatic requirements.   Immunization history:  Immunization History  Administered Date(s) Administered   Influenza,inj,Quad PF,6+ Mos 06/08/2020   Influenza-Unspecified 06/01/2021, 06/05/2022   PFIZER(Purple Top)SARS-COV-2 Vaccination 04/26/2020   Tdap 05/06/2012, 06/30/2022     The following portions of the patient's history were reviewed and updated as appropriate: allergies, current medications, past medical history, past social history, past surgical history and problem list.  Objective:  There were no vitals filed for this visit.  Physical Exam Vitals and nursing note reviewed.  Constitutional:      Appearance: Normal appearance. She is obese.  HENT:     Head: Normocephalic and atraumatic.     Mouth/Throat:     Mouth: Mucous membranes are moist.     Pharynx: Oropharynx is clear. No oropharyngeal exudate or posterior oropharyngeal erythema.  Eyes:     Conjunctiva/sclera: Conjunctivae normal.  Neck:     Thyroid: No thyroid mass, thyromegaly or thyroid tenderness.  Pulmonary:     Effort: Pulmonary effort is normal.  Abdominal:     Palpations: Abdomen is soft. There is no mass.     Tenderness: There is no abdominal tenderness. There  is no rebound.     Comments: Soft without masses or tenderness  Genitourinary:    General: Normal vulva.     Exam position: Lithotomy position.     Pubic Area: No rash or pubic lice.      Labia:        Right: No rash or lesion.        Left: No rash or lesion.      Vagina: Vaginal discharge (white creamy leukorrhea, ph<4.5) present. No erythema, bleeding or lesions.     Cervix: No cervical motion tenderness, discharge,  friability, lesion or erythema.     Uterus: Normal.      Rectum: Normal.     Comments: pH = <4.5 Lymphadenopathy:     Head:     Right side of head: No preauricular or posterior auricular adenopathy.     Left side of head: No preauricular or posterior auricular adenopathy.     Cervical: No cervical adenopathy.     Upper Body:     Right upper body: No supraclavicular, axillary or epitrochlear adenopathy.     Left upper body: No supraclavicular, axillary or epitrochlear adenopathy.     Lower Body: No right inguinal adenopathy. No left inguinal adenopathy.  Skin:    General: Skin is warm and dry.     Findings: No rash.  Neurological:     Mental Status: She is alert and oriented to person, place, and time.      Assessment and Plan:  Virginia Harris is a 42 y.o. female presenting to the [facility] Department for STI screening  1. Screening examination for venereal disease Treat as contact to Chlamydia per standing orders Treat wet mount per standing orders Immunization nurse consult  - Syphilis Serology, [facility] - Chlamydia/Gonorrhea [facility] - HIV/HCV [facility] - Gonococcus culture - WET PREP FOR TRICH, YEAST, CLUE  2. History of tubal ligation 2022    Patient accepted all screenings including oral, vaginal CT/GC and bloodwork for HIV/RPR, and wet prep. Patient meets criteria for HepB screening? Yes. Ordered? no Patient meets criteria for HepC screening? Yes. Ordered? yes  Treat wet prep per standing order Discussed time line for [facility] results and that patient will be called with positive results and encouraged patient to call if she had not heard in 2 weeks.  Counseled to return or seek care for continued or worsening symptoms Recommended repeat testing in 3 months with positive results. Recommended condom use with all sex  Patient is currently using Ster[facility] for [facility] Women to prevent pregnancy.    No follow-ups on file.  No future  appointments.  Alberteen Spindle, CNM

## 2023-05-22 NOTE — Progress Notes (Signed)
Wet prep reviewed, no treatment indicated. Patient treated as contact to Chlamydia per SO. Marland KitchenBurt Knack, RN

## 2023-05-27 LAB — GONOCOCCUS CULTURE

## 2023-06-16 ENCOUNTER — Other Ambulatory Visit
Admission: RE | Admit: 2023-06-16 | Discharge: 2023-06-16 | Disposition: A | Discharge status: Home / Self Care | Payer: Managed Care, Other (non HMO) | Source: Ambulatory Visit | Attending: Oncology | Admitting: Oncology

## 2023-06-16 DIAGNOSIS — Z006 Encounter for examination for normal comparison and control in clinical research program: Secondary | ICD-10-CM | POA: Insufficient documentation

## 2023-06-28 LAB — HELIX MOLECULAR SCREEN: Genetic Analysis Overall Interpretation: NEGATIVE

## 2024-03-10 ENCOUNTER — Ambulatory Visit: Payer: Self-pay | Admitting: *Deleted

## 2024-03-10 NOTE — Telephone Encounter (Signed)
 Will need to schedule 2 separate office visits. 1 for physical other for books.

## 2024-03-10 NOTE — Telephone Encounter (Signed)
 Copied from CRM [id number]. Topic: Clinical - Red Word Triage >> Mar 10, 2024  9:24 AM Charlet HERO wrote: Red Word that prompted transfer to Nurse Triage: Patient is calling about a abscess in her genital area is draining but will not drain compltly, painful and red. Has been there for 2 weeks so it drains and then comes back she is also wanting to schedule physical. Reason for Disposition  2 or more boils    Between rectum and vagina  Answer Assessment - Initial Assessment Questions 1. APPEARANCE of BOIL: What does the boil look like?      I have an abscess that is draining for 2 weeks.  It is draining on and off. 2. LOCATION: Where is the boil located?      It's between my butt and vaginal area 3. NUMBER: How many boils are there?      2 abscesses.   I had one before.    4. SIZE: How big is the boil? (e.g., inches, cm; compare to size of a coin or other object)     It's draining, but it does hurt before it drains.   Before it drained it had a head on it. 5. ONSET: When did the boil start?     2 weeks 6. PAIN: Is there any pain? If Yes, ask: How bad is the pain?   (Scale 1-10; or mild, moderate, severe)     Yes 7. FEVER: Do you have a fever? If Yes, ask: What is it, how was it measured, and when did it start?      No 8. SOURCE: Have you been around anyone with boils or other Staph infections? Have you ever had boils before?     No   9. OTHER SYMPTOMS: Do you have any other symptoms? (e.g., shaking chills, weakness, rash elsewhere on body)     No 10. PREGNANCY: Is there any chance you are pregnant? When was your last menstrual period?       Not asked  Protocols used: Boil (Skin Abscess)-A-AH FYI Only or Action Required?: FYI only for provider.  Patient was last seen in primary care on 08/29/2022 by Antonette Angeline ORN, NP. Called Nurse Triage reporting Cyst. Symptoms began a week ago. Interventions attempted: Nothing. Symptoms are: gradually worsening.  Triage  Disposition: See Within 2 Weeks in [place] understands and will follow disposition?: Yes

## 2024-03-18 ENCOUNTER — Encounter: Payer: Self-pay | Admitting: Internal Medicine

## 2024-03-18 ENCOUNTER — Ambulatory Visit: Admitting: Internal Medicine

## 2024-03-18 VITALS — BP 130/78 | Ht 65.0 in | Wt 262.4 lb

## 2024-03-18 DIAGNOSIS — L0231 Cutaneous abscess of buttock: Secondary | ICD-10-CM

## 2024-03-18 MED ORDER — SULFAMETHOXAZOLE-TRIMETHOPRIM 800-160 MG PO TABS: 1.0000 | ORAL_TABLET | Freq: Two times a day (BID) | ORAL | 0 refills | Status: AC

## 2024-03-18 NOTE — Progress Notes (Signed)
 Subjective:    Patient ID: Virginia Harris, female    DOB: [birth year], 43 y.o.   MRN: [id number]  HPI  Discussed the use of AI scribe software for clinical note transcription with the patient, who gave verbal consent to proceed.  Virginia Harris is a 43 year old female who presents with recurrent abscesses in the buttock area.  She has been experiencing recurrent abscesses in the buttock area, initially noticing a recurrence in the same spot a few weeks ago. Subsequently, two more abscesses developed on the opposite side. The abscesses are located between the buttock and vaginal area, affecting both the left and right sides. She reports it has drained completely, although there is still a small amount of residual swelling.  No abscesses in other areas such as the axilla or underneath the breast. She has not experienced systemic symptoms such as fever, chills, nausea, or vomiting.  She has not been using antibiotics recently.       Review of Systems   Past Medical History:  Diagnosis Date   Asthma    controlled   DVT (deep venous thrombosis) (HCC) 2021    Current Outpatient Medications  Medication Sig Dispense Refill   clindamycin -benzoyl peroxide (BENZACLIN) gel Apply topically 2 (two) times daily. (Patient not taking: Reported on 05/22/2023) 25 g 0   metroNIDAZOLE  (FLAGYL ) 500 MG tablet Take 1 tablet (500 mg total) by mouth 2 (two) times daily. Do not drink alcohol while taking this medicine. (Patient not taking: Reported on 05/22/2023) 14 tablet 0   No current facility-administered medications for this visit.    Allergies  Allergen Reactions   Fruit Extracts Hives    strawberries    Family History  Problem Relation Age of Onset   Diabetes Mother    Breast cancer Maternal Grandmother     Social History   Socioeconomic History   Marital status: Single    Spouse name: Not on file   Number of children: 4   Years of education: Not on file   Highest education level:  Not on file  Occupational History   Not on file  Tobacco Use   Smoking status: Never   Smokeless tobacco: Never  Vaping Use   Vaping status: Never Used  Substance and Sexual Activity   Alcohol use: Yes    Alcohol/week: 1.0 standard drink of alcohol    Types: 1 Glasses of wine per week    Comment: last use 05/18/23 2x/wk   Drug use: Not Currently    Types: Marijuana    Comment: last use 2021   Sexual activity: Yes    Partners: Male    Birth control/protection: None  Other Topics Concern   Not on file  Social History Narrative   Not on file   Social Drivers of Health   Financial Resource Strain: Not on file  Food Insecurity: Not on file  Transportation Needs: Not on file  Physical Activity: Not on file  Stress: Not on file  Social Connections: Not on file  Intimate Partner Violence: Not on file     Constitutional: Denies fever, malaise, fatigue, headache or abrupt weight changes.  Respiratory: Denies difficulty breathing, shortness of breath, cough or sputum production.   Cardiovascular: Denies chest pain, chest tightness, palpitations or swelling in the hands or feet.  Musculoskeletal: Denies decrease in range of motion, difficulty with gait, muscle pain or joint pain and swelling.  Skin: Patient reports abscess of buttock (now resolved).  Denies ulcercations.    No  other specific complaints in a complete review of systems (except as listed in HPI above).      Objective:   Physical Exam  BP 130/78 (BP Location: Left Arm, Patient Position: Sitting, Cuff Size: Large)   Ht 5' 5 (1.651 m)   Wt 262 lb 6.4 oz (119 kg)   BMI 43.67 kg/m   Wt Readings from Last 3 Encounters:  08/29/22 248 lb (112.5 kg)  07/11/22 241 lb (109.3 kg)  06/30/22 240 lb (108.9 kg)    General: Appears her stated age, obese, in NAD. Skin: She does not want me to visualize this area.  She reports the abscess is essentially gone however there is a hard nodule still in the area that is not  draining anymore. Cardiovascular: Normal rate. Pulmonary/Chest: Normal effort. Neurological: Alert and oriented.   BMET    Component Value Date/Time   NA 138 06/30/2022 1128   K 5.0 06/30/2022 1128   CL 106 06/30/2022 1128   CO2 24 06/30/2022 1128   GLUCOSE 91 06/30/2022 1128   BUN 8 06/30/2022 1128   CREATININE 1.13 (H) 06/30/2022 1128   CALCIUM 10.7 (H) 06/30/2022 1128   GFRNONAA 56 (L) 06/16/2021 0754   GFRAA >60 05/04/2020 1048    Lipid Panel     Component Value Date/Time   CHOL 227 (H) 06/30/2022 1128   TRIG 143 06/30/2022 1128   HDL 85 06/30/2022 1128   CHOLHDL 2.7 06/30/2022 1128   VLDL 33.4 06/08/2020 0957   LDLCALC 116 (H) 06/30/2022 1128    CBC    Component Value Date/Time   WBC 7.6 06/30/2022 1128   RBC 4.34 06/30/2022 1128   HGB 13.4 06/30/2022 1128   HGB 10.9 (L) 11/24/2011 1421   HCT 39.4 06/30/2022 1128   HCT 26.7 (L) 11/26/2011 0610   PLT 339 06/30/2022 1128   PLT 276 11/24/2011 1421   MCV 90.8 06/30/2022 1128   MCV 85 11/24/2011 1421   MCH 30.9 06/30/2022 1128   MCHC 34.0 06/30/2022 1128   RDW 13.9 06/30/2022 1128   RDW 16.1 (H) 11/24/2011 1421   LYMPHSABS 3.3 06/16/2021 0754   LYMPHSABS 1.8 11/24/2011 1421   MONOABS 0.5 06/16/2021 0754   MONOABS 0.5 11/24/2011 1421   EOSABS 0.3 06/16/2021 0754   EOSABS 0.2 11/24/2011 1421   BASOSABS 0.1 06/16/2021 0754   BASOSABS 0.0 11/24/2011 1421    Hgb A1C Lab Results  Component Value Date   HGBA1C 5.6 06/30/2022            Assessment & Plan:   Assessment and Plan    Recurrent Boils Recurrent boils in the buttocks area, likely due to friction, tight clothing, and poor air circulation. Considered hidradenitis suppurativa but less severe. Discussed bacteremia risk. Septra  DS chosen for MRSA coverage. - Prescribe Septra  DS (sulfamethoxazole  and trimethoprim ) twice daily for 7 days. - Advise warm compresses and hot baths to aid drainage. - Instruct to monitor for systemic symptoms and  seek medical attention if she develops.   Schedule an appointment for your annual exam Virginia Laura, NP

## 2024-03-18 NOTE — Patient Instructions (Signed)
Skin Abscess  A skin abscess is an infected spot of skin. It can have pus in it. An abscess can happen in any part of your body. Some abscesses break open (rupture) on their own. Most keep getting worse unless they are treated. If your abscess is not treated, the infection can spread deeper into your body and blood. This can make you feel sick. What are the causes? Germs that enter your skin. This may happen if you have: A cut or scrape. A wound from a needle or an insect bite. Blocked oil or sweat glands. A problem with the spot where your hair goes into your skin. A fluid-filled sac called a cyst under your skin. What increases the risk? Having problems with how your blood moves through your body. Having a weak body defense system (immune system). Having diabetes. Having dry and irritated skin. Needing to get shots often. Putting drugs into your body with a needle. Having a splinter or something else in your skin. Smoking. What are the signs or symptoms? A firm bump under your skin that hurts. A bump with pus at the top. Redness and swelling. Warm or tender spots. A sore on the skin. How is this treated? You may need to: Put a heat pack or a warm, wet washcloth on the spot. Have the pus drained. Take antibiotics. Follow these instructions at home: Medicines Take over-the-counter and prescription medicines only as told by your doctor. If you were prescribed antibiotics, take them as told by your doctor. Do not stop taking them even if you start to feel better. Abscess care  If you have an abscess that has not drained, put heat on it. Use the heat source that your doctor recommends, such as a moist heat pack or a heating pad. Place a towel between your skin and the heat source. Leave the heat on for 20-30 minutes. If your skin turns bright red, take off the heat right away to prevent burns. The risk of burns is higher if you cannot feel pain, heat, or cold. Follow  instructions from your doctor about how to take care of your abscess. Make sure you: Cover the abscess with a bandage. Wash your hands with soap and water for at least 20 seconds before and after you change your bandage. If you cannot use soap and water, use hand sanitizer. Change your bandage as told by your doctor. Check your abscess every day for signs that the infection is getting worse. Check for: More redness, swelling, or pain. More fluid or blood. Warmth. More pus or a worse smell. General instructions To keep the infection from spreading: Do not share personal items or towels. Do not go in a hot tub with others. Avoid making skin contact with others. Be careful when you get rid of used bandages or any pus from the abscess. Do not smoke or use any products that contain nicotine or tobacco. If you need help quitting, ask your doctor. Contact a doctor if: You see red streaks on your skin near the abscess. You have any signs of worse infection. You vomit every time you eat or drink. You have a fever, chills, or muscle aches. The cyst or abscess comes back. Get help right away if: You have very bad pain. You make less pee (urine) than normal. This information is not intended to replace advice given to you by your health care provider. Make sure you discuss any questions you have with your health care provider. Document Revised: 04/05/2022  Document Reviewed: 04/05/2022 Elsevier Patient Education  2024 [employer].

## 2024-03-20 ENCOUNTER — Other Ambulatory Visit (HOSPITAL_COMMUNITY)
Admission: RE | Admit: 2024-03-20 | Discharge: 2024-03-20 | Disposition: A | Discharge status: Home / Self Care | Source: Ambulatory Visit | Attending: Internal Medicine | Admitting: Internal Medicine

## 2024-03-20 ENCOUNTER — Encounter: Payer: Self-pay | Admitting: Internal Medicine

## 2024-03-20 ENCOUNTER — Ambulatory Visit (INDEPENDENT_AMBULATORY_CARE_PROVIDER_SITE_OTHER): Admitting: Internal Medicine

## 2024-03-20 VITALS — BP 128/72 | Ht 65.0 in | Wt 259.4 lb

## 2024-03-20 DIAGNOSIS — E782 Mixed hyperlipidemia: Secondary | ICD-10-CM

## 2024-03-20 DIAGNOSIS — Z124 Encounter for screening for malignant neoplasm of cervix: Secondary | ICD-10-CM

## 2024-03-20 DIAGNOSIS — E66813 Obesity, class 3: Secondary | ICD-10-CM

## 2024-03-20 DIAGNOSIS — Z0001 Encounter for general adult medical examination with abnormal findings: Secondary | ICD-10-CM

## 2024-03-20 DIAGNOSIS — R7303 Prediabetes: Secondary | ICD-10-CM | POA: Diagnosis not present

## 2024-03-20 DIAGNOSIS — Z113 Encounter for screening for infections with a predominantly sexual mode of transmission: Secondary | ICD-10-CM

## 2024-03-20 DIAGNOSIS — Z6841 Body Mass Index (BMI) 40.0 and over, adult: Secondary | ICD-10-CM

## 2024-03-20 NOTE — Patient Instructions (Signed)

## 2024-03-20 NOTE — Assessment & Plan Note (Signed)
 Encourage diet and exercise for weight loss

## 2024-03-20 NOTE — Progress Notes (Signed)
 Subjective:    Patient ID: Virginia Harris, female    DOB: [birth year], 43 y.o.   MRN: [id number]  HPI  Patient presents to clinic today for her annual exam.  Flu: 06/2023 Tetanus: 06/2022 COVID: Pfizer x1 Pap smear: 02/2019, thinks she had it in 2022. Mammogram: 08/2022 Vision screening: annually Dentist: biannually  Diet: She does eat some lean meat. She consumes some fruit and veggies. She does eat some fried foods. She drinks mostly water. Exercise: Walking  Review of Systems  Past Medical History:  Diagnosis Date   Asthma    controlled   DVT (deep venous thrombosis) (HCC) 2021    Current Outpatient Medications  Medication Sig Dispense Refill   sulfamethoxazole -trimethoprim  (BACTRIM  DS) 800-160 MG tablet Take 1 tablet by mouth 2 (two) times daily for 7 days. 14 tablet 0   No current facility-administered medications for this visit.    Allergies  Allergen Reactions   Fruit Extracts Hives    strawberries    Family History  Problem Relation Age of Onset   Diabetes Mother    Breast cancer Maternal Grandmother     Social History   Socioeconomic History   Marital status: Single    Spouse name: Not on file   Number of children: 4   Years of education: Not on file   Highest education level: Not on file  Occupational History   Not on file  Tobacco Use   Smoking status: Never   Smokeless tobacco: Never  Vaping Use   Vaping status: Never Used  Substance and Sexual Activity   Alcohol use: Yes    Alcohol/week: 1.0 standard drink of alcohol    Types: 1 Glasses of wine per week    Comment: last use 05/18/23 2x/wk   Drug use: Not Currently    Types: Marijuana    Comment: last use 2021   Sexual activity: Yes    Partners: Male    Birth control/protection: None  Other Topics Concern   Not on file  Social History Narrative   Not on file   Social Drivers of Health   Financial Resource Strain: Not on file  Food Insecurity: Not on file  Transportation  Needs: Not on file  Physical Activity: Not on file  Stress: Not on file  Social Connections: Not on file  Intimate Partner Violence: Not on file     Constitutional: Denies fever, malaise, fatigue, headache or abrupt weight changes.  HEENT: Denies eye pain, eye redness, ear pain, wax buildup, runny nose, nasal congestion, bloody nose, or sore throat. Respiratory: Denies difficulty breathing, shortness of breath, cough or sputum production.   Cardiovascular: Denies chest pain, chest tightness, palpitations or swelling in the hands or feet.  Gastrointestinal: Pt reports intermittent constipation. Denies abdominal pain, bloating, diarrhea or blood in the stool.  GU: Denies urgency, frequency, pain with urination, burning sensation, blood in urine, odor or discharge. Musculoskeletal: Denies decrease in range of motion, difficulty with gait, muscle pain or joint pain and swelling.  Skin: Denies redness, rashes, lesions or ulcercations.  Neurological: Denies dizziness, difficulty with memory, difficulty with speech or problems with balance and coordination.  Psych: Pt has a history of anxiety and depression. Denies  SI/HI.  No other specific complaints in a complete review of systems (except as listed in HPI above).     Objective:   Physical Exam  BP 128/72 (BP Location: Left Arm, Patient Position: Sitting, Cuff Size: Large)   Ht 5' 5 (1.651 m)  Wt 259 lb 6.4 oz (117.7 kg)   BMI 43.17 kg/m    Wt Readings from Last 3 Encounters:  03/18/24 262 lb 6.4 oz (119 kg)  08/29/22 248 lb (112.5 kg)  07/11/22 241 lb (109.3 kg)    General: Appears her stated age, obese, in NAD. Skin: Warm, dry and intact.  HEENT: Head: normal shape and size; Eyes: sclera white, no icterus, conjunctiva pink, PERRLA and EOMs intact;  Neck:  Neck supple, trachea midline. No masses, lumps present. Thyromegaly noted. Cardiovascular: Normal rate and rhythm. S1,S2 noted.  No murmur, rubs or gallops noted. No JVD or  BLE edema.  Pulmonary/Chest: Normal effort and positive vesicular breath sounds. No respiratory distress. No wheezes, rales or ronchi noted.  Abdomen:  Normal bowel sound Pelvic: Normal female anatomy.  Cervix friable but no mass or lesion noted.  No CMT.  Adnexa nonpalpable. Musculoskeletal: Strength 5/5 BUE/BLE. No difficulty with gait.  Neurological: Alert and oriented. Cranial nerves II-XII grossly intact. Coordination normal.  Psychiatric: Mood and affect mildly flat. Behavior is normal. Judgment and thought content normal.     BMET    Component Value Date/Time   NA 138 06/30/2022 1128   K 5.0 06/30/2022 1128   CL 106 06/30/2022 1128   CO2 24 06/30/2022 1128   GLUCOSE 91 06/30/2022 1128   BUN 8 06/30/2022 1128   CREATININE 1.13 (H) 06/30/2022 1128   CALCIUM 10.7 (H) 06/30/2022 1128   GFRNONAA 56 (L) 06/16/2021 0754   GFRAA >60 05/04/2020 1048    Lipid Panel     Component Value Date/Time   CHOL 227 (H) 06/30/2022 1128   TRIG 143 06/30/2022 1128   HDL 85 06/30/2022 1128   CHOLHDL 2.7 06/30/2022 1128   VLDL 33.4 06/08/2020 0957   LDLCALC 116 (H) 06/30/2022 1128    CBC    Component Value Date/Time   WBC 7.6 06/30/2022 1128   RBC 4.34 06/30/2022 1128   HGB 13.4 06/30/2022 1128   HGB 10.9 (L) 11/24/2011 1421   HCT 39.4 06/30/2022 1128   HCT 26.7 (L) 11/26/2011 0610   PLT 339 06/30/2022 1128   PLT 276 11/24/2011 1421   MCV 90.8 06/30/2022 1128   MCV 85 11/24/2011 1421   MCH 30.9 06/30/2022 1128   MCHC 34.0 06/30/2022 1128   RDW 13.9 06/30/2022 1128   RDW 16.1 (H) 11/24/2011 1421   LYMPHSABS 3.3 06/16/2021 0754   LYMPHSABS 1.8 11/24/2011 1421   MONOABS 0.5 06/16/2021 0754   MONOABS 0.5 11/24/2011 1421   EOSABS 0.3 06/16/2021 0754   EOSABS 0.2 11/24/2011 1421   BASOSABS 0.1 06/16/2021 0754   BASOSABS 0.0 11/24/2011 1421    Hgb A1C Lab Results  Component Value Date   HGBA1C 5.6 06/30/2022           Assessment & Plan:   Preventative Health  Maintenance:  Flu shot UTD Tdap UTD Encouraged her to get her COVID booster Pap smear today with STD screening She declines mammogram at this time Encouraged her to consume a balanced diet and exercise regimen Advised her to see an eye doctor and dentist annually We will check CBC, c-Met, lipid, A1c, HIV, RPR and hep C today   RTC in 6 months, follow-up chronic conditions Angeline Laura, NP

## 2024-03-21 ENCOUNTER — Ambulatory Visit: Payer: Self-pay | Admitting: Internal Medicine

## 2024-03-21 DIAGNOSIS — N1831 Chronic kidney disease, stage 3a: Secondary | ICD-10-CM

## 2024-03-21 LAB — CBC
HCT: 40.7 % (ref 35.0–45.0)
Hemoglobin: 13.1 g/dL (ref 11.7–15.5)
MCH: 30.3 pg (ref 27.0–33.0)
MCHC: 32.2 g/dL (ref 32.0–36.0)
MCV: 94.2 fL (ref 80.0–100.0)
MPV: 10.6 fL (ref 7.5–12.5)
Platelets: 397 Thousand/uL (ref 140–400)
RBC: 4.32 Million/uL (ref 3.80–5.10)
RDW: 14.5 % (ref 11.0–15.0)
WBC: 7.2 Thousand/uL (ref 3.8–10.8)

## 2024-03-21 LAB — RPR: RPR Ser Ql: NONREACTIVE

## 2024-03-21 LAB — LIPID PANEL
Cholesterol: 278 mg/dL — ABNORMAL HIGH (ref <200)
HDL: 59 mg/dL (ref >=50)
LDL Cholesterol (Calc): 173 mg/dL — ABNORMAL HIGH
Non-HDL Cholesterol (Calc): 219 mg/dL — ABNORMAL HIGH (ref <130)
Total CHOL/HDL Ratio: 4.7 (calc) (ref <5.0)
Triglycerides: 301 mg/dL — ABNORMAL HIGH (ref <150)

## 2024-03-21 LAB — COMPREHENSIVE METABOLIC PANEL WITH GFR
AG Ratio: 1.9 (calc) (ref 1.0–2.5)
ALT: 22 U/L (ref 6–29)
AST: 23 U/L (ref 10–30)
Albumin: 4.6 g/dL (ref 3.6–5.1)
Alkaline phosphatase (APISO): 66 U/L (ref 31–125)
BUN/Creatinine Ratio: 8 (calc) (ref 6–22)
BUN: 10 mg/dL (ref 7–25)
CO2: 22 mmol/L (ref 20–32)
Calcium: 10.9 mg/dL — ABNORMAL HIGH (ref 8.6–10.2)
Chloride: 104 mmol/L (ref 98–110)
Creat: 1.24 mg/dL — ABNORMAL HIGH (ref 0.50–0.99)
Globulin: 2.4 g/dL (ref 1.9–3.7)
Glucose, Bld: 80 mg/dL (ref 65–99)
Potassium: 4.7 mmol/L (ref 3.5–5.3)
Sodium: 136 mmol/L (ref 135–146)
Total Bilirubin: 0.6 mg/dL (ref 0.2–1.2)
Total Protein: 7 g/dL (ref 6.1–8.1)
eGFR: 55 mL/min/1.73m2 — ABNORMAL LOW (ref >=60)

## 2024-03-21 LAB — HIV ANTIBODY (ROUTINE TESTING W REFLEX): HIV 1&2 Ab, 4th Generation: NONREACTIVE

## 2024-03-21 LAB — HEMOGLOBIN A1C
Hgb A1c MFr Bld: 5.7 % — ABNORMAL HIGH (ref <5.7)
Mean Plasma Glucose: 117 mg/dL
eAG (mmol/L): 6.5 mmol/L

## 2024-03-21 LAB — HEPATITIS C ANTIBODY: Hepatitis C Ab: NONREACTIVE

## 2024-03-21 NOTE — Addendum Note (Signed)
 Addended by: ANTONETTE ANGELINE ORN on: 03/21/2024 10:45 AM   Modules accepted: Orders

## 2024-03-28 LAB — CYTOLOGY - PAP
Adequacy: ABSENT
Chlamydia: NEGATIVE
Comment: NEGATIVE
Comment: NEGATIVE
Comment: NEGATIVE
Comment: NORMAL
Diagnosis: UNDETERMINED — AB
High risk HPV: NEGATIVE
Neisseria Gonorrhea: NEGATIVE
Trichomonas: NEGATIVE

## 2024-04-02 ENCOUNTER — Other Ambulatory Visit: Payer: Self-pay | Admitting: Nephrology

## 2024-04-02 DIAGNOSIS — R944 Abnormal results of kidney function studies: Secondary | ICD-10-CM

## 2024-04-09 ENCOUNTER — Ambulatory Visit
Admission: RE | Admit: 2024-04-09 | Discharge: 2024-04-09 | Disposition: A | Discharge status: Home / Self Care | Source: Ambulatory Visit | Attending: Nephrology | Admitting: Nephrology

## 2024-04-09 DIAGNOSIS — R944 Abnormal results of kidney function studies: Secondary | ICD-10-CM | POA: Insufficient documentation

## 2024-04-23 ENCOUNTER — Encounter: Payer: Self-pay | Admitting: Nephrology

## 2024-04-23 ENCOUNTER — Other Ambulatory Visit: Payer: Self-pay | Admitting: Nephrology

## 2024-05-13 ENCOUNTER — Other Ambulatory Visit

## 2024-05-19 ENCOUNTER — Encounter: Payer: Self-pay | Admitting: Radiology

## 2024-05-19 ENCOUNTER — Encounter
Admission: RE | Admit: 2024-05-19 | Discharge: 2024-05-19 | Disposition: A | Discharge status: Home / Self Care | Source: Ambulatory Visit | Attending: Nephrology | Admitting: Nephrology

## 2024-05-19 MED ORDER — TECHNETIUM TC 99M SESTAMIBI - CARDIOLITE
25.0000 | Freq: Once | INTRAVENOUS | Status: AC | PRN
  Administered 2024-05-19: 25.17 via INTRAVENOUS

## 2024-05-20 ENCOUNTER — Encounter: Payer: Self-pay | Admitting: Internal Medicine

## 2024-05-22 ENCOUNTER — Ambulatory Visit (INDEPENDENT_AMBULATORY_CARE_PROVIDER_SITE_OTHER): Admitting: Internal Medicine

## 2024-05-22 ENCOUNTER — Encounter: Payer: Self-pay | Admitting: Internal Medicine

## 2024-05-22 ENCOUNTER — Other Ambulatory Visit (HOSPITAL_COMMUNITY)
Admission: RE | Admit: 2024-05-22 | Discharge: 2024-05-22 | Disposition: A | Discharge status: Home / Self Care | Source: Ambulatory Visit | Attending: Internal Medicine | Admitting: Internal Medicine

## 2024-05-22 VITALS — BP 124/78 | Ht 65.0 in | Wt 252.2 lb

## 2024-05-22 DIAGNOSIS — N898 Other specified noninflammatory disorders of vagina: Secondary | ICD-10-CM | POA: Insufficient documentation

## 2024-05-22 DIAGNOSIS — L700 Acne vulgaris: Secondary | ICD-10-CM | POA: Diagnosis not present

## 2024-05-22 DIAGNOSIS — Z23 Encounter for immunization: Secondary | ICD-10-CM

## 2024-05-22 MED ORDER — CLINDAMYCIN PHOS-BENZOYL PEROX 1-5 % EX GEL
Freq: Two times a day (BID) | CUTANEOUS | 0 refills | Status: AC
Start: 2024-05-22 — End: ?

## 2024-05-22 NOTE — Patient Instructions (Signed)
 Irritation of the Vagina (Vaginitis): What to Know  Vaginitis is irritation and swelling of the vagina. It happens when the usual balance of bacteria and yeast in the vagina changes. This change causes some types to grow too much. This overgrowth leads to vaginitis. What are the causes? Bacteria. Yeast, which is a fungus. A parasite. A virus. Low hormones in the body. This can occur during pregnancy, breastfeeding, or after menopause. What increases the risk? Irritants, such as douches, bubble baths, scented tampons, and feminine sprays. Antibiotics. Poor hygiene. Wearing tight pants or thong underwear. Some birth control methods, such as diaphragms, vaginal sponges, or spermicides. Having sex without a condom or having sex with more than one person. Infections. Uncontrolled diabetes. What are the signs or symptoms? Abnormal fluid from the vagina. The fluid may be: White, gray, or yellow. Thick, white, and cheesy. Frothy and yellow or green. A bad smell from the vagina. Itching, pain, or swelling in the vagina. Pain during sex. Pain or burning when you pee. How is this diagnosed? This condition is diagnosed based on your symptoms, medical history, and an exam. This may include a pelvic exam. Tests may also be done. Tests may be done to: Check the pH level of your vagina. Check the fluid in your vagina. How is this treated? Treatment will depend on what is causing your vaginitis. Treatment may include: Antibiotics. Antifungal medicines. Medicines to treat symptoms if you have a virus. Your sex partner should also be treated. Estrogen medicines. Medicines to treat allergies. The medicines may be pills or creams. Follow these instructions at home: Lifestyle Keep the area around your vagina clean and dry. Avoid using soap. Rinse the area with water. Until your health care provider says it's okay: Do not douche. Do not use tampons. Use pads, if needed. Do not have sex. Wipe  from front to back after going to the bathroom. When the provider says it's okay, practice safe sex. Use condoms. General instructions Take your medicines only as told. If you were given antibiotics, take them as told. Do not stop taking them even if you start to feel better. How is this prevented? Use mild, unscented products. Avoid the following products if they are scented: Sprays. Detergents. Tampons. Products for cleaning the vagina. Soaps or bubble baths. Let air reach your genital area. To do this: Wear cotton underwear. Do not wear underwear while you sleep. Do not wear tight pants and underwear or pantyhose without a cotton panel. Do not wear thong underwear. Take off any wet clothing, such as bathing suits, as soon as possible. Practice safe sex. Use condoms. Contact a health care provider if: You have pain in the belly or around the pelvis. You have a fever or chills. You have symptoms that last for more than 2-3 days. This information is not intended to replace advice given to you by your health care provider. Make sure you discuss any questions you have with your health care provider. Document Revised: 05/24/2023 Document Reviewed: 01/01/2023 Elsevier Patient Education  2024 [employer].

## 2024-05-22 NOTE — Progress Notes (Signed)
 Subjective:    Patient ID: Virginia Harris, female    DOB: 1981/07/01, 43 y.o.   MRN: [id number]  HPI  Discussed the use of AI scribe software for clinical note transcription with the patient, who gave verbal consent to proceed.  Virginia Harris is a 43 year old female who presents with vaginal discharge.  She has been experiencing vaginal discharge for the past two weeks, described as sometimes clear and whitish, with an associated odor. No itching, pelvic cramping, or urinary symptoms are present. She has a history of bacterial vaginosis and suspects this may be a recurrence. She has been using boric acid suppositories, which provided slight relief but did not resolve the issue.  She is not currently on antibiotics, does not take bubble baths, and does not douche.  She requests a refill of her acne medication, BenzaClin. She reports a current mild outbreak with a few spots remaining.       Review of Systems  Past Medical History:  Diagnosis Date   Asthma    controlled   DVT (deep venous thrombosis) (HCC) 2021    No current outpatient medications on file.   No current facility-administered medications for this visit.    Allergies  Allergen Reactions   Fruit Extracts Hives    strawberries    Family History  Problem Relation Age of Onset   Diabetes Mother    Breast cancer Maternal Grandmother     Social History   Socioeconomic History   Marital status: Single    Spouse name: Not on file   Number of children: 4   Years of education: Not on file   Highest education level: Not on file  Occupational History   Not on file  Tobacco Use   Smoking status: Never   Smokeless tobacco: Never  Vaping Use   Vaping status: Never Used  Substance and Sexual Activity   Alcohol use: Yes    Alcohol/week: 1.0 standard drink of alcohol    Types: 1 Glasses of wine per week    Comment: last use 05/18/23 2x/wk   Drug use: Not Currently    Types: Marijuana    Comment: last use  2021   Sexual activity: Yes    Partners: Male    Birth control/protection: None  Other Topics Concern   Not on file  Social History Narrative   Not on file   Social Drivers of Health   Financial Resource Strain: Not on file  Food Insecurity: Not on file  Transportation Needs: Not on file  Physical Activity: Not on file  Stress: Not on file  Social Connections: Not on file  Intimate Partner Violence: Not on file     Constitutional: Denies fever, malaise, fatigue, headache or abrupt weight changes.  HEENT: Denies eye pain, eye redness, ear pain, wax buildup, runny nose, nasal congestion, bloody nose, or sore throat. Respiratory: Denies difficulty breathing, shortness of breath, cough or sputum production.   Cardiovascular: Denies chest pain, chest tightness, palpitations or swelling in the hands or feet.  Gastrointestinal: Pt reports intermittent constipation. Denies abdominal pain, bloating, diarrhea or blood in the stool.  GU: Patient reports vaginal discharge and odor.  Denies urgency, frequency, pain with urination, burning sensation, blood in urine. Musculoskeletal: Denies decrease in range of motion, difficulty with gait, muscle pain or joint pain and swelling.  Skin: Patient reports acne.  Denies redness, rashes, lesions or ulcercations.  Neurological: Denies dizziness, difficulty with memory, difficulty with speech or problems with balance and  coordination.  Psych: Pt has a history of anxiety and depression. Denies  SI/HI.  No other specific complaints in a complete review of systems (except as listed in HPI above).     Objective:   Physical Exam  There were no vitals taken for this visit.   Wt Readings from Last 3 Encounters:  03/20/24 259 lb 6.4 oz (117.7 kg)  03/18/24 262 lb 6.4 oz (119 kg)  08/29/22 248 lb (112.5 kg)    General: Appears her stated age, obese, in NAD. Skin: Warm, dry and intact.  Acne noted on face. Cardiovascular: Normal  rate. Pulmonary/Chest: Normal effort. No respiratory distress. Pelvic: Self swab. Musculoskeletal: No difficulty with gait.  Neurological: Alert and oriented.     BMET    Component Value Date/Time   NA 136 03/20/2024 1538   K 4.7 03/20/2024 1538   CL 104 03/20/2024 1538   CO2 22 03/20/2024 1538   GLUCOSE 80 03/20/2024 1538   BUN 10 03/20/2024 1538   CREATININE 1.24 (H) 03/20/2024 1538   CALCIUM 10.9 (H) 03/20/2024 1538   GFRNONAA 56 (L) 06/16/2021 0754   GFRAA >60 05/04/2020 1048    Lipid Panel     Component Value Date/Time   CHOL 278 (H) 03/20/2024 1538   TRIG 301 (H) 03/20/2024 1538   HDL 59 03/20/2024 1538   CHOLHDL 4.7 03/20/2024 1538   VLDL 33.4 06/08/2020 0957   LDLCALC 173 (H) 03/20/2024 1538    CBC    Component Value Date/Time   WBC 7.2 03/20/2024 1538   RBC 4.32 03/20/2024 1538   HGB 13.1 03/20/2024 1538   HGB 10.9 (L) 11/24/2011 1421   HCT 40.7 03/20/2024 1538   HCT 26.7 (L) 11/26/2011 0610   PLT 397 03/20/2024 1538   PLT 276 11/24/2011 1421   MCV 94.2 03/20/2024 1538   MCV 85 11/24/2011 1421   MCH 30.3 03/20/2024 1538   MCHC 32.2 03/20/2024 1538   RDW 14.5 03/20/2024 1538   RDW 16.1 (H) 11/24/2011 1421   LYMPHSABS 3.3 06/16/2021 0754   LYMPHSABS 1.8 11/24/2011 1421   MONOABS 0.5 06/16/2021 0754   MONOABS 0.5 11/24/2011 1421   EOSABS 0.3 06/16/2021 0754   EOSABS 0.2 11/24/2011 1421   BASOSABS 0.1 06/16/2021 0754   BASOSABS 0.0 11/24/2011 1421    Hgb A1C Lab Results  Component Value Date   HGBA1C 5.7 (H) 03/20/2024           Assessment & Plan:   Assessment and Plan    Vaginal discharge and odor Clear, whitish discharge with odor. Differential includes BV and yeast infection. Previous partial improvement with boric acid. Prefers vaginal treatment. - Perform swab test for BV and yeast infection. - Prescribe vaginal metronidazole  suppositories if BV confirmed. - Advised on probiotics as adjunctive treatment.  Acne Intermittent  mild acne. - Prescribe Benzaclin cream.       RTC in 4 months, follow-up chronic conditions Angeline Laura, NP

## 2024-05-23 ENCOUNTER — Ambulatory Visit: Payer: Self-pay | Admitting: Internal Medicine

## 2024-05-23 LAB — CERVICOVAGINAL ANCILLARY ONLY
Bacterial Vaginitis (gardnerella): POSITIVE — AB
Candida Glabrata: NEGATIVE
Candida Vaginitis: NEGATIVE
Comment: NEGATIVE
Comment: NEGATIVE
Comment: NEGATIVE

## 2024-05-23 MED ORDER — METRONIDAZOLE 0.75 % EX GEL: 1.0000 | Freq: Two times a day (BID) | CUTANEOUS | 0 refills | Status: AC

## 2024-05-26 MED ORDER — METRONIDAZOLE 0.75 % VA GEL: 1.0000 | Freq: Two times a day (BID) | VAGINAL | 0 refills | Status: DC

## 2024-07-10 ENCOUNTER — Ambulatory Visit: Admitting: Internal Medicine

## 2024-07-10 ENCOUNTER — Other Ambulatory Visit (HOSPITAL_COMMUNITY)
Admission: RE | Admit: 2024-07-10 | Discharge: 2024-07-10 | Disposition: A | Discharge status: Home / Self Care | Source: Ambulatory Visit | Attending: Internal Medicine | Admitting: Internal Medicine

## 2024-07-10 ENCOUNTER — Encounter: Payer: Self-pay | Admitting: Internal Medicine

## 2024-07-10 VITALS — BP 122/78 | Ht 65.0 in | Wt 245.8 lb

## 2024-07-10 DIAGNOSIS — Z113 Encounter for screening for infections with a predominantly sexual mode of transmission: Secondary | ICD-10-CM | POA: Diagnosis not present

## 2024-07-10 DIAGNOSIS — N898 Other specified noninflammatory disorders of vagina: Secondary | ICD-10-CM

## 2024-07-10 NOTE — Progress Notes (Signed)
 Subjective:    Patient ID: Virginia Harris, female    DOB: 1981-08-29, 43 y.o.   MRN: [id number]  HPI  Discussed the use of AI scribe software for clinical note transcription with the patient, who gave verbal consent to proceed.  Virginia Harris is a 43 year old female who presents for STD screening and evaluation of possible yeast infection.  She has symptoms consistent with a yeast infection, including itching and irritation, which she treated with an over-the-counter Monistat three-day regimen last week. There was some improvement in symptoms, but she still experiences some odor.  She has a history of recurrent bacterial vaginosis and has previously been treated with metronidazole  vaginal gel.  No pelvic pain, abnormal vaginal bleeding, urinary symptoms, fever, chills, nausea, vomiting, or low back pain.       Review of Systems  Past Medical History:  Diagnosis Date   Asthma    controlled   DVT (deep venous thrombosis) (HCC) 2021    Current Outpatient Medications  Medication Sig Dispense Refill   clindamycin -benzoyl peroxide (BENZACLIN) gel Apply topically 2 (two) times daily. 25 g 0   metroNIDAZOLE  (METROGEL ) 0.75 % vaginal gel Place 1 Applicatorful vaginally 2 (two) times daily. 70 g 0   No current facility-administered medications for this visit.    Allergies  Allergen Reactions   Fruit Extracts Hives    strawberries    Family History  Problem Relation Age of Onset   Diabetes Mother    Breast cancer Maternal Grandmother     Social History   Socioeconomic History   Marital status: Single    Spouse name: Not on file   Number of children: 4   Years of education: Not on file   Highest education level: Not on file  Occupational History   Not on file  Tobacco Use   Smoking status: Never   Smokeless tobacco: Never  Vaping Use   Vaping status: Never Used  Substance and Sexual Activity   Alcohol use: Yes    Alcohol/week: 1.0 standard drink of alcohol     Types: 1 Glasses of wine per week    Comment: last use 05/18/23 2x/wk   Drug use: Not Currently    Types: Marijuana    Comment: last use 2021   Sexual activity: Yes    Partners: Male    Birth control/protection: None  Other Topics Concern   Not on file  Social History Narrative   Not on file   Social Drivers of Health   Financial Resource Strain: Not on file  Food Insecurity: Not on file  Transportation Needs: Not on file  Physical Activity: Not on file  Stress: Not on file  Social Connections: Not on file  Intimate Partner Violence: Not on file     Constitutional: Denies fever, malaise, fatigue, headache or abrupt weight changes.  Respiratory: Denies difficulty breathing, shortness of breath, cough or sputum production.   Cardiovascular: Denies chest pain, chest tightness, palpitations or swelling in the hands or feet.  GU: Patient reports vaginal itching, irritation and odor.  Denies urgency, frequency, pain with urination, burning sensation, blood in urine.  No other specific complaints in a complete review of systems (except as listed in HPI above).     Objective:   Physical Exam  BP 122/78 (BP Location: Left Arm, Patient Position: Sitting, Cuff Size: Large)   Ht 5' 5 (1.651 m)   Wt 245 lb 12.8 oz (111.5 kg)   BMI 40.90 kg/m  Wt Readings from Last 3 Encounters:  05/22/24 252 lb 3.2 oz (114.4 kg)  03/20/24 259 lb 6.4 oz (117.7 kg)  03/18/24 262 lb 6.4 oz (119 kg)    General: Appears her stated age, obese, in NAD.e noted on face. Cardiovascular: Normal rate. Pulmonary/Chest: Normal effort. No respiratory distress. Pelvic: Self swab.  Neurological: Alert and oriented.     BMET    Component Value Date/Time   NA 136 03/20/2024 1538   K 4.7 03/20/2024 1538   CL 104 03/20/2024 1538   CO2 22 03/20/2024 1538   GLUCOSE 80 03/20/2024 1538   BUN 10 03/20/2024 1538   CREATININE 1.24 (H) 03/20/2024 1538   CALCIUM 10.9 (H) 03/20/2024 1538   GFRNONAA 56 (L)  06/16/2021 0754   GFRAA >60 05/04/2020 1048    Lipid Panel     Component Value Date/Time   CHOL 278 (H) 03/20/2024 1538   TRIG 301 (H) 03/20/2024 1538   HDL 59 03/20/2024 1538   CHOLHDL 4.7 03/20/2024 1538   VLDL 33.4 06/08/2020 0957   LDLCALC 173 (H) 03/20/2024 1538    CBC    Component Value Date/Time   WBC 7.2 03/20/2024 1538   RBC 4.32 03/20/2024 1538   HGB 13.1 03/20/2024 1538   HGB 10.9 (L) 11/24/2011 1421   HCT 40.7 03/20/2024 1538   HCT 26.7 (L) 11/26/2011 0610   PLT 397 03/20/2024 1538   PLT 276 11/24/2011 1421   MCV 94.2 03/20/2024 1538   MCV 85 11/24/2011 1421   MCH 30.3 03/20/2024 1538   MCHC 32.2 03/20/2024 1538   RDW 14.5 03/20/2024 1538   RDW 16.1 (H) 11/24/2011 1421   LYMPHSABS 3.3 06/16/2021 0754   LYMPHSABS 1.8 11/24/2011 1421   MONOABS 0.5 06/16/2021 0754   MONOABS 0.5 11/24/2011 1421   EOSABS 0.3 06/16/2021 0754   EOSABS 0.2 11/24/2011 1421   BASOSABS 0.1 06/16/2021 0754   BASOSABS 0.0 11/24/2011 1421    Hgb A1C Lab Results  Component Value Date   HGBA1C 5.7 (H) 03/20/2024           Assessment & Plan:  Assessment and Plan    Screening for sexually transmitted infections Undergoing routine STI screening. Symptoms of itching and irritation reported. - Ordered STI screening tests for gonorrhea, chlamydia, trichomoniasis. - Will communicate results via MyChart.  Recurrent bacterial vaginosis Recurrent BV, difficult to eradicate. Advised on hygiene and management strategies. Prefers oral medication. -Wet prep today to check for BV and yeast - Advised against using douches, scented products, and antibacterial soaps. - Recommended daily probiotic use. - Suggested boric acid suppositories once daily for 30 days. - Prescribed oral medication for BV treatment.  Vulvovaginal candidiasis, recently self-treated Recently self-treated with Monistat. Symptoms improved but some discomfort persists.  -Wet prep today to check for BV and yeast      RTC in 3 months, follow-up chronic conditions Angeline Laura, NP

## 2024-07-10 NOTE — Patient Instructions (Signed)
 Irritation of the Vagina (Vaginitis): What to Know  Vaginitis is irritation and swelling of the vagina. It happens when the usual balance of bacteria and yeast in the vagina changes. This change causes some types to grow too much. This overgrowth leads to vaginitis. What are the causes? Bacteria. Yeast, which is a fungus. A parasite. A virus. Low hormones in the body. This can occur during pregnancy, breastfeeding, or after menopause. What increases the risk? Irritants, such as douches, bubble baths, scented tampons, and feminine sprays. Antibiotics. Poor hygiene. Wearing tight pants or thong underwear. Some birth control methods, such as diaphragms, vaginal sponges, or spermicides. Having sex without a condom or having sex with more than one person. Infections. Uncontrolled diabetes. What are the signs or symptoms? Abnormal fluid from the vagina. The fluid may be: White, gray, or yellow. Thick, white, and cheesy. Frothy and yellow or green. A bad smell from the vagina. Itching, pain, or swelling in the vagina. Pain during sex. Pain or burning when you pee. How is this diagnosed? This condition is diagnosed based on your symptoms, medical history, and an exam. This may include a pelvic exam. Tests may also be done. Tests may be done to: Check the pH level of your vagina. Check the fluid in your vagina. How is this treated? Treatment will depend on what is causing your vaginitis. Treatment may include: Antibiotics. Antifungal medicines. Medicines to treat symptoms if you have a virus. Your sex partner should also be treated. Estrogen medicines. Medicines to treat allergies. The medicines may be pills or creams. Follow these instructions at home: Lifestyle Keep the area around your vagina clean and dry. Avoid using soap. Rinse the area with water. Until your health care provider says it's okay: Do not douche. Do not use tampons. Use pads, if needed. Do not have sex. Wipe  from front to back after going to the bathroom. When the provider says it's okay, practice safe sex. Use condoms. General instructions Take your medicines only as told. If you were given antibiotics, take them as told. Do not stop taking them even if you start to feel better. How is this prevented? Use mild, unscented products. Avoid the following products if they are scented: Sprays. Detergents. Tampons. Products for cleaning the vagina. Soaps or bubble baths. Let air reach your genital area. To do this: Wear cotton underwear. Do not wear underwear while you sleep. Do not wear tight pants and underwear or pantyhose without a cotton panel. Do not wear thong underwear. Take off any wet clothing, such as bathing suits, as soon as possible. Practice safe sex. Use condoms. Contact a health care provider if: You have pain in the belly or around the pelvis. You have a fever or chills. You have symptoms that last for more than 2-3 days. This information is not intended to replace advice given to you by your health care provider. Make sure you discuss any questions you have with your health care provider. Document Revised: 05/24/2023 Document Reviewed: 01/01/2023 Elsevier Patient Education  2024 [employer].

## 2024-07-14 ENCOUNTER — Ambulatory Visit: Payer: Self-pay | Admitting: Internal Medicine

## 2024-07-14 LAB — CERVICOVAGINAL ANCILLARY ONLY
Bacterial Vaginitis (gardnerella): POSITIVE — AB
Candida Glabrata: NEGATIVE
Candida Vaginitis: NEGATIVE
Chlamydia: NEGATIVE
Comment: NEGATIVE
Comment: NEGATIVE
Comment: NEGATIVE
Comment: NEGATIVE
Comment: NEGATIVE
Comment: NORMAL
Neisseria Gonorrhea: NEGATIVE
Trichomonas: NEGATIVE

## 2024-07-14 MED ORDER — METRONIDAZOLE 500 MG PO TABS: 500.0000 mg | ORAL_TABLET | Freq: Two times a day (BID) | ORAL | 0 refills | Status: AC

## 2024-09-16 ENCOUNTER — Ambulatory Visit: Admitting: Internal Medicine
